# Patient Record
Sex: Female | Born: 1990 | Race: White | Hispanic: No | Marital: Single | Smoking: Never smoker
Health system: Southern US, Community
[De-identification: ages and names within clinical notes are randomized; demographics above are authoritative.]

## PROBLEM LIST (undated history)

## (undated) ENCOUNTER — Inpatient Hospital Stay (HOSPITAL_COMMUNITY): Payer: Self-pay

## (undated) DIAGNOSIS — F419 Anxiety disorder, unspecified: Secondary | ICD-10-CM

---
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
## 2011-04-26 LAB — URINALYSIS, ROUTINE W REFLEX MICROSCOPIC
Bilirubin Urine: NEGATIVE
Glucose, UA: NEGATIVE
Hgb urine dipstick: NEGATIVE
Ketones, ur: NEGATIVE
Nitrite: NEGATIVE
Protein, ur: NEGATIVE
Specific Gravity, Urine: 1.025
Urobilinogen, UA: 0.2
pH: 6

## 2013-07-10 ENCOUNTER — Emergency Department (INDEPENDENT_AMBULATORY_CARE_PROVIDER_SITE_OTHER)
Admission: EM | Admit: 2013-07-10 | Discharge: 2013-07-10 | Disposition: A | Discharge status: Home / Self Care | Payer: Self-pay | Source: Home / Self Care

## 2013-07-10 ENCOUNTER — Encounter (HOSPITAL_COMMUNITY): Payer: Self-pay | Admitting: Emergency Medicine

## 2013-07-10 DIAGNOSIS — R6889 Other general symptoms and signs: Secondary | ICD-10-CM

## 2013-07-10 MED ORDER — HYDROCOD POLST-CHLORPHEN POLST 10-8 MG/5ML PO LQCR: 5.0000 mL | Freq: Two times a day (BID) | ORAL | Status: DC | PRN

## 2013-07-10 MED ORDER — ONDANSETRON HCL 4 MG PO TABS: 4.0000 mg | ORAL_TABLET | Freq: Four times a day (QID) | ORAL | Status: DC

## 2013-07-10 NOTE — ED Notes (Signed)
General body aches, ST, cough

## 2013-07-10 NOTE — ED Provider Notes (Signed)
CSN: [id number]     Arrival date & time 07/10/13  1442 History   First MD Initiated Contact with Patient 07/10/13 1608     Chief Complaint  Patient presents with  . Influenza   (Consider location/radiation/quality/duration/timing/severity/associated sxs/prior Treatment) HPI Comments: 3 days ago this 22 year old female experienced a rather sudden onset of generalized bodyaches, vomiting, sore throat, nasal congestion, cough, fatigue and malaise and headache. She is able to drink small sips of fluid without vomiting. She has been taking Alka-Seltzer cold plus without much relief. She is primarily been in bed for the past 3 days. She states that 2 members of her family had similar symptoms last week.   History reviewed. No pertinent past medical history. History reviewed. No pertinent past surgical history. History reviewed. No pertinent family history. History  Substance Use Topics  . Smoking status: Never Smoker   . Smokeless tobacco: Not on file  . Alcohol Use: Yes   OB History   Grav Para Term Preterm Abortions TAB SAB Ect Mult Living                 Review of Systems  Constitutional: Positive for chills, activity change, appetite change and fatigue. Negative for fever.  HENT: Positive for congestion, postnasal drip, rhinorrhea and sore throat. Negative for facial swelling.   Eyes: Negative.   Respiratory: Positive for cough. Negative for shortness of breath and wheezing.   Cardiovascular: Negative.   Gastrointestinal: Positive for nausea and vomiting. Negative for abdominal pain and blood in stool.  Genitourinary: Negative.   Musculoskeletal: Positive for myalgias. Negative for neck pain and neck stiffness.  Skin: Negative for pallor and rash.  Neurological: Negative.     Allergies  Review of patient's allergies indicates not on file.  Home Medications   Current Outpatient Rx  Name  Route  Sig  Dispense  Refill  . chlorpheniramine-HYDROcodone (TUSSIONEX PENNKINETIC ER)  10-8 MG/5ML LQCR   Oral   Take 5 mLs by mouth every 12 (twelve) hours as needed for cough.   60 mL   0   . ondansetron (ZOFRAN) 4 MG tablet   Oral   Take 1 tablet (4 mg total) by mouth every 6 (six) hours.   12 tablet   0    BP 131/83  Pulse 80  Temp(Src) 98.4 F (36.9 C) (Oral)  Resp 18  SpO2 97%  LMP 06/16/2013 Physical Exam  Nursing note and vitals reviewed. Constitutional: She is oriented to person, place, and time. She appears well-developed and well-nourished. No distress.  HENT:  Head: Normocephalic and atraumatic.  Mouth/Throat: Oropharyngeal exudate present.   bilateral TMs are normal Oropharynx with minor erythema and clear PND.  Eyes: Conjunctivae and EOM are normal.  Neck: Normal range of motion. Neck supple.  Cardiovascular: Normal rate, regular rhythm, normal heart sounds and intact distal pulses.   No murmur heard. Pulmonary/Chest: Effort normal and breath sounds normal. No respiratory distress. She has no wheezes. She has no rales.  Abdominal: Soft. She exhibits no distension and no mass. There is no tenderness. There is no rebound and no guarding.  Musculoskeletal: Normal range of motion. She exhibits no edema and no tenderness.  Lymphadenopathy:    She has no cervical adenopathy.  Neurological: She is alert and oriented to person, place, and time. She exhibits normal muscle tone.  Skin: Skin is warm and dry. No rash noted. No erythema.  Psychiatric: She has a normal mood and affect.    ED Course  Procedures (  including critical care time) Labs Review Labs Reviewed - No data to display Imaging Review No results found.    MDM   1. Flu-like symptoms      Clear fluids, small frequent amts zofran 4mg  tabs tussonex q 12h prn 60 cc  Sedation precautions given. Rest Tylenol prn  Hayden Rasmussen, NP 07/10/13 [phone]

## 2013-07-11 NOTE — ED Provider Notes (Signed)
Medical screening examination/treatment/procedure(s) were performed by resident physician or non-physician practitioner and as supervising physician I was immediately available for consultation/collaboration.   Barkley Bruns MD.   Linna Hoff, MD 07/11/13 [phone]

## 2015-12-04 ENCOUNTER — Ambulatory Visit (HOSPITAL_COMMUNITY)
Admission: EM | Admit: 2015-12-04 | Discharge: 2015-12-04 | Disposition: A | Discharge status: Home / Self Care | Payer: Self-pay | Attending: Emergency Medicine | Admitting: Emergency Medicine

## 2015-12-04 ENCOUNTER — Encounter (HOSPITAL_COMMUNITY): Payer: Self-pay | Admitting: Emergency Medicine

## 2015-12-04 DIAGNOSIS — F419 Anxiety disorder, unspecified: Secondary | ICD-10-CM

## 2015-12-04 DIAGNOSIS — R0789 Other chest pain: Secondary | ICD-10-CM

## 2015-12-04 NOTE — ED Notes (Signed)
Pt discharged by Hayden Rasmussendavid mabe, np

## 2015-12-04 NOTE — Discharge Instructions (Signed)
Generalized Anxiety Disorder Recommend exercising, walking, dancing or whatever as many days of the week as possible and a couple of hours before bedtime since it is worse at night. Your heart and lungs sound very healthy. Generalized anxiety disorder (GAD) is a mental disorder. It interferes with life functions, including relationships, work, and school. GAD is different from normal anxiety, which everyone experiences at some point in their lives in response to specific life events and activities. Normal anxiety actually helps us prepare for and get through these life events and activities. Normal anxiety goes away after the event or activity is over.  GAD causes anxiety that is not necessarily related to specific events or activities. It also causes excess anxiety in proportion to specific events or activities. The anxiety associated with GAD is also difficult to control. GAD can vary from mild to severe. People with severe GAD can have intense waves of anxiety with physical symptoms (panic attacks).  SYMPTOMS The anxiety and worry associated with GAD are difficult to control. This anxiety and worry are related to many life events and activities and also occur more days than not for 6 months or longer. People with GAD also have three or more of the following symptoms (one or more in children):  Restlessness.   Fatigue.  Difficulty concentrating.   Irritability.  Muscle tension.  Difficulty sleeping or unsatisfying sleep. DIAGNOSIS GAD is diagnosed through an assessment by your health care provider. Your health care provider will ask you questions aboutyour mood,physical symptoms, and events in your life. Your health care provider may ask you about your medical history and use of alcohol or drugs, including prescription medicines. Your health care provider may also do a physical exam and blood tests. Certain medical conditions and the use of certain substances can cause symptoms similar to  those associated with GAD. Your health care provider may refer you to a mental health specialist for further evaluation. TREATMENT The following therapies are usually used to treat GAD:   Medication. Antidepressant medication usually is prescribed for long-term daily control. Antianxiety medicines may be added in severe cases, especially when panic attacks occur.   Talk therapy (psychotherapy). Certain types of talk therapy can be helpful in treating GAD by providing support, education, and guidance. A form of talk therapy called cognitive behavioral therapy can teach you healthy ways to think about and react to daily life events and activities.  Stress managementtechniques. These include yoga, meditation, and exercise and can be very helpful when they are practiced regularly. A mental health specialist can help determine which treatment is best for you. Some people see improvement with one therapy. However, other people require a combination of therapies.   This information is not intended to replace advice given to you by your health care provider. Make sure you discuss any questions you have with your health care provider.   Document Released: 11/05/2012 Document Revised: 08/01/2014 Document Reviewed: 11/05/2012 Elsevier Interactive Patient Education 2016 [employer]  Panic Attacks Panic attacks are sudden, short feelings of great fear or discomfort. You may have them for no reason when you are relaxed, when you are uneasy (anxious), or when you are sleeping.  HOME CARE  Take all your medicines as told.  Check with your doctor before starting new medicines.  Keep all doctor visits. GET HELP IF:  You are not able to take your medicines as told.  Your symptoms do not get better.  Your symptoms get worse. GET HELP RIGHT AWAY IF:  Your attacks seem different than your normal attacks.  You have thoughts about hurting yourself or others.  You take panic attack medicine and you  have a side effect. MAKE SURE YOU:  Understand these instructions.  Will watch your condition.  Will get help right away if you are not doing well or get worse.   This information is not intended to replace advice given to you by your health care provider. Make sure you discuss any questions you have with your health care provider.   Document Released: 08/13/2010 Document Revised: 05/01/2013 Document Reviewed: 02/22/2013 Elsevier Interactive Patient Education 2016 [employer]  Chest Pain Observation It is often hard to give a specific diagnosis for the cause of chest pain. Among other possibilities your symptoms might be caused by inadequate oxygen delivery to your heart (angina). Angina that is not treated or evaluated can lead to a heart attack (myocardial infarction) or death. Blood tests, electrocardiograms, and X-rays may have been done to help determine a possible cause of your chest pain. After evaluation and observation, your health care provider has determined that it is unlikely your pain was caused by an unstable condition that requires hospitalization. However, a full evaluation of your pain may need to be completed, with additional diagnostic testing as directed. It is very important to keep your follow-up appointments. Not keeping your follow-up appointments could result in permanent heart damage, disability, or death. If there is any problem keeping your follow-up appointments, you must call your health care provider. HOME CARE INSTRUCTIONS  Due to the slight chance that your pain could be angina, it is important to follow your health care provider's treatment plan and also maintain a healthy lifestyle:  Maintain or work toward achieving a healthy weight.  Stay physically active and exercise regularly.  Decrease your salt intake.  Eat a balanced, healthy diet. Talk to a dietitian to learn about heart-healthy foods.  Increase your fiber intake by including whole grains,  vegetables, fruits, and nuts in your diet.  Avoid situations that cause stress, anger, or depression.  Take medicines as advised by your health care provider. Report any side effects to your health care provider. Do not stop medicines or adjust the dosages on your own.  Quit smoking. Do not use nicotine patches or gum until you check with your health care provider.  Keep your blood pressure, blood sugar, and cholesterol levels within normal limits.  Limit alcohol intake to no more than 1 drink per day for women who are not pregnant and 2 drinks per day for men.  Do not abuse drugs. SEEK IMMEDIATE MEDICAL CARE IF: You have severe chest pain or pressure which may include symptoms such as:  You feel pain or pressure in your arms, neck, jaw, or back.  You have severe back or abdominal pain, feel sick to your stomach (nauseous), or throw up (vomit).  You are sweating profusely.  You are having a fast or irregular heartbeat.  You feel short of breath while at rest.  You notice increasing shortness of breath during rest, sleep, or with activity.  You have chest pain that does not get better after rest or after taking your usual medicine.  You wake from sleep with chest pain.  You are unable to sleep because you cannot breathe.  You develop a frequent cough or you are coughing up blood.  You feel dizzy, faint, or experience extreme fatigue.  You develop severe weakness, dizziness, fainting, or chills. Any of these symptoms may  represent a serious problem that is an emergency. Do not wait to see if the symptoms will go away. Call your local emergency services (911 in the [place] Do not drive yourself to the hospital. MAKE SURE YOU:  Understand these instructions.  Will watch your condition.  Will get help right away if you are not doing well or get worse.   This information is not intended to replace advice given to you by your health care provider. Make sure you discuss any  questions you have with your health care provider.   Document Released: 08/13/2010 Document Revised: 07/16/2013 Document Reviewed: 01/10/2013 Elsevier Interactive Patient Education 2016 [employer] Inc.  Nonspecific Chest Pain  Chest pain can be caused by many different conditions. There is always a chance that your pain could be related to something serious, such as a heart attack or a blood clot in your lungs. Chest pain can also be caused by conditions that are not life-threatening. If you have chest pain, it is very important to follow up with your health care provider. CAUSES  Chest pain can be caused by:  Heartburn.  Pneumonia or bronchitis.  Anxiety or stress.  Inflammation around your heart (pericarditis) or lung (pleuritis or pleurisy).  A blood clot in your lung.  A collapsed lung (pneumothorax). It can develop suddenly on its own (spontaneous pneumothorax) or from trauma to the chest.  Shingles infection (varicella-zoster virus).  Heart attack.  Damage to the bones, muscles, and cartilage that make up your chest wall. This can include:  Bruised bones due to injury.  Strained muscles or cartilage due to frequent or repeated coughing or overwork.  Fracture to one or more ribs.  Sore cartilage due to inflammation (costochondritis). RISK FACTORS  Risk factors for chest pain may include:  Activities that increase your risk for trauma or injury to your chest.  Respiratory infections or conditions that cause frequent coughing.  Medical conditions or overeating that can cause heartburn.  Heart disease or family history of heart disease.  Conditions or health behaviors that increase your risk of developing a blood clot.  Having had chicken pox (varicella zoster). SIGNS AND SYMPTOMS Chest pain can feel like:  Burning or tingling on the surface of your chest or deep in your chest.  Crushing, pressure, aching, or squeezing pain.  Dull or sharp pain that is worse  when you move, cough, or take a deep breath.  Pain that is also felt in your back, neck, shoulder, or arm, or pain that spreads to any of these areas. Your chest pain may come and go, or it may stay constant. DIAGNOSIS Lab tests or other studies may be needed to find the cause of your pain. Your health care provider may have you take a test called an ambulatory ECG (electrocardiogram). An ECG records your heartbeat patterns at the time the test is performed. You may also have other tests, such as:  Transthoracic echocardiogram (TTE). During echocardiography, sound waves are used to create a picture of all of the heart structures and to look at how blood flows through your heart.  Transesophageal echocardiogram (TEE).This is a more advanced imaging test that obtains images from inside your body. It allows your health care provider to see your heart in finer detail.  Cardiac monitoring. This allows your health care provider to monitor your heart rate and rhythm in real time.  Holter monitor. This is a portable device that records your heartbeat and can help to diagnose abnormal heartbeats. It allows  your health care provider to track your heart activity for several days, if needed.  Stress tests. These can be done through exercise or by taking medicine that makes your heart beat more quickly.  Blood tests.  Imaging tests. TREATMENT  Your treatment depends on what is causing your chest pain. Treatment may include:  Medicines. These may include:  Acid blockers for heartburn.  Anti-inflammatory medicine.  Pain medicine for inflammatory conditions.  Antibiotic medicine, if an infection is present.  Medicines to dissolve blood clots.  Medicines to treat coronary artery disease.  Supportive care for conditions that do not require medicines. This may include:  Resting.  Applying heat or cold packs to injured areas.  Limiting activities until pain decreases. HOME CARE  INSTRUCTIONS  If you were prescribed an antibiotic medicine, finish it all even if you start to feel better.  Avoid any activities that bring on chest pain.  Do not use any tobacco products, including cigarettes, chewing tobacco, or electronic cigarettes. If you need help quitting, ask your health care provider.  Do not drink alcohol.  Take medicines only as directed by your health care provider.  Keep all follow-up visits as directed by your health care provider. This is important. This includes any further testing if your chest pain does not go away.  If heartburn is the cause for your chest pain, you may be told to keep your head raised (elevated) while sleeping. This reduces the chance that acid will go from your stomach into your esophagus.  Make lifestyle changes as directed by your health care provider. These may include:  Getting regular exercise. Ask your health care provider to suggest some activities that are safe for you.  Eating a heart-healthy diet. A registered dietitian can help you to learn healthy eating options.  Maintaining a healthy weight.  Managing diabetes, if necessary.  Reducing stress. SEEK MEDICAL CARE IF:  Your chest pain does not go away after treatment.  You have a rash with blisters on your chest.  You have a fever. SEEK IMMEDIATE MEDICAL CARE IF:   Your chest pain is worse.  You have an increasing cough, or you cough up blood.  You have severe abdominal pain.  You have severe weakness.  You faint.  You have chills.  You have sudden, unexplained chest discomfort.  You have sudden, unexplained discomfort in your arms, back, neck, or jaw.  You have shortness of breath at any time.  You suddenly start to sweat, or your skin gets clammy.  You feel nauseous or you vomit.  You suddenly feel light-headed or dizzy.  Your heart begins to beat quickly, or it feels like it is skipping beats. These symptoms may represent a serious  problem that is an emergency. Do not wait to see if the symptoms will go away. Get medical help right away. Call your local emergency services (911 in the [place] Do not drive yourself to the hospital.   This information is not intended to replace advice given to you by your health care provider. Make sure you discuss any questions you have with your health care provider.   Document Released: 04/20/2005 Document Revised: 08/01/2014 Document Reviewed: 02/14/2014 Elsevier Interactive Patient Education [employer]

## 2015-12-04 NOTE — ED Notes (Signed)
C/o intermittent SOB and CP onset x1 week... Does not have med ins and no PCP... Voices no other concerns A&O x4... No acute distress.

## 2015-12-04 NOTE — ED Provider Notes (Signed)
CSN: [id number]     Arrival date & time 12/04/15  1744 History   First MD Initiated Contact with Patient 12/04/15 1827     Chief Complaint  Patient presents with  . Shortness of Breath  . Chest Pain   (Consider location/radiation/quality/duration/timing/severity/associated sxs/prior Treatment) HPI Comments: 25 year old female states that she feels like she needs to yawn but cannot. She is unable to force herself to yawn. When specifically asked she states that she does feel like she has to take deep breaths more often. Sometimes she feels like she cannot quite get a deep enough breath. She denies shortness of breath, cough or recent chills or fever. She states it occurs primarily at night when she is alone. Occurs less during the day when she is working or distracted. She does not have exertional symptoms. She noticed the need to take a deep breath frequently approximately one week ago. Today she had a couple of episodes of right anterior chest discomfort. No discomfort now. Nothing makes it worse and nothing makes it better. Denies GI or GU symptoms.  She does have a history of panic attacks.   History reviewed. No pertinent past medical history. History reviewed. No pertinent past surgical history. No family history on file. Social History  Substance Use Topics  . Smoking status: Never Smoker   . Smokeless tobacco: None  . Alcohol Use: Yes   OB History    No data available     Review of Systems  Constitutional: Negative for fever, activity change and fatigue.  HENT: Positive for congestion, postnasal drip and rhinorrhea. Negative for sore throat, tinnitus and trouble swallowing.   Eyes: Negative.   Respiratory: Negative for cough, choking, chest tightness, wheezing and stridor.   Cardiovascular: Positive for chest pain. Negative for palpitations and leg swelling.  Gastrointestinal: Negative.   Genitourinary: Negative.   Musculoskeletal: Negative.   Neurological: Negative.    Psychiatric/Behavioral: The patient is nervous/anxious.     Allergies  Review of patient's allergies indicates no known allergies.  Home Medications   Prior to Admission medications   Medication Sig Start Date End Date Taking? Authorizing Provider  chlorpheniramine-HYDROcodone (TUSSIONEX PENNKINETIC ER) 10-8 MG/5ML LQCR Take 5 mLs by mouth every 12 (twelve) hours as needed for cough. 07/10/13   Hayden Rasmussenavid Shabnam Ladd, NP  ondansetron (ZOFRAN) 4 MG tablet Take 1 tablet (4 mg total) by mouth every 6 (six) hours. 07/10/13   Hayden Rasmussenavid Montserrath Madding, NP   Meds Ordered and Administered this Visit  Medications - No data to display  LMP 11/27/2015 No data found.   Physical Exam  Constitutional: She is oriented to person, place, and time. She appears well-developed and well-nourished. No distress.  HENT:  Mouth/Throat: No oropharyngeal exudate.  Bilateral TMs are normal. Oropharynx with scant clear drainage otherwise normal.  Eyes: Conjunctivae and EOM are normal. Pupils are equal, round, and reactive to light.  Neck: Normal range of motion. Neck supple.  Cardiovascular: Normal rate, regular rhythm, normal heart sounds and intact distal pulses.   No murmur heard. Pulmonary/Chest: Effort normal and breath sounds normal. No respiratory distress. She has no wheezes. She has no rales. She exhibits no tenderness.  Abdominal: Soft. There is no tenderness.  Musculoskeletal: Normal range of motion. She exhibits no edema or tenderness.  Lymphadenopathy:    She has no cervical adenopathy.  Neurological: She is alert and oriented to person, place, and time. She exhibits normal muscle tone.  Skin: Skin is warm and dry.  Psychiatric: She has a normal  mood and affect.  Nursing note and vitals reviewed.   ED Course  Procedures (including critical care time)  Labs Review Labs Reviewed - No data to display  Imaging Review No results found.   Visual Acuity Review  Right Eye Distance:   Left Eye Distance:    Bilateral Distance:    Right Eye Near:   Left Eye Near:    Bilateral Near:         MDM   1. Anxiety   2. Atypical chest pain    Recommend exercising, walking, dancing or whatever as many days of the week as possible and a couple of hours before bedtime since it is worse at night. Your heart and lungs sound very healthy. Reassurance Return as needed  No family history of early death or known early CAD or pulmonary problems. Nonsmoker. Denies use of drugs.    Hayden Rasmussen, NP 12/04/15 1911

## 2016-02-19 ENCOUNTER — Encounter (HOSPITAL_COMMUNITY): Payer: Self-pay | Admitting: Certified Registered Nurse Anesthetist

## 2016-02-19 ENCOUNTER — Emergency Department (HOSPITAL_COMMUNITY): Payer: Self-pay | Admitting: Certified Registered Nurse Anesthetist

## 2016-02-19 ENCOUNTER — Observation Stay (HOSPITAL_COMMUNITY)
Admission: EM | Admit: 2016-02-19 | Discharge: 2016-02-20 | Disposition: A | Discharge status: Home / Self Care | Payer: Self-pay | Attending: Orthopedic Surgery | Admitting: Orthopedic Surgery

## 2016-02-19 ENCOUNTER — Encounter (HOSPITAL_COMMUNITY)
Admission: EM | Disposition: A | Discharge status: Home / Self Care | Payer: Self-pay | Source: Home / Self Care | Attending: Emergency Medicine

## 2016-02-19 ENCOUNTER — Emergency Department (HOSPITAL_COMMUNITY): Payer: Self-pay

## 2016-02-19 DIAGNOSIS — S66829A Laceration of other specified muscles, fascia and tendons at wrist and hand level, unspecified hand, initial encounter: Secondary | ICD-10-CM | POA: Diagnosis present

## 2016-02-19 DIAGNOSIS — F419 Anxiety disorder, unspecified: Secondary | ICD-10-CM | POA: Insufficient documentation

## 2016-02-19 DIAGNOSIS — S62391B Other fracture of second metacarpal bone, left hand, initial encounter for open fracture: Secondary | ICD-10-CM | POA: Insufficient documentation

## 2016-02-19 DIAGNOSIS — S62502B Fracture of unspecified phalanx of left thumb, initial encounter for open fracture: Secondary | ICD-10-CM | POA: Insufficient documentation

## 2016-02-19 DIAGNOSIS — S64491A Injury of digital nerve of left index finger, initial encounter: Secondary | ICD-10-CM | POA: Insufficient documentation

## 2016-02-19 DIAGNOSIS — W25XXXA Contact with sharp glass, initial encounter: Secondary | ICD-10-CM | POA: Insufficient documentation

## 2016-02-19 DIAGNOSIS — S61213A Laceration without foreign body of left middle finger without damage to nail, initial encounter: Secondary | ICD-10-CM

## 2016-02-19 DIAGNOSIS — S66123A Laceration of flexor muscle, fascia and tendon of left middle finger at wrist and hand level, initial encounter: Secondary | ICD-10-CM | POA: Insufficient documentation

## 2016-02-19 DIAGNOSIS — S66523A Laceration of intrinsic muscle, fascia and tendon of left middle finger at wrist and hand level, initial encounter: Secondary | ICD-10-CM

## 2016-02-19 DIAGNOSIS — S61211A Laceration without foreign body of left index finger without damage to nail, initial encounter: Secondary | ICD-10-CM | POA: Insufficient documentation

## 2016-02-19 DIAGNOSIS — S61409A Unspecified open wound of unspecified hand, initial encounter: Secondary | ICD-10-CM | POA: Diagnosis present

## 2016-02-19 DIAGNOSIS — Z23 Encounter for immunization: Secondary | ICD-10-CM | POA: Insufficient documentation

## 2016-02-19 DIAGNOSIS — S61012A Laceration without foreign body of left thumb without damage to nail, initial encounter: Secondary | ICD-10-CM

## 2016-02-19 DIAGNOSIS — IMO0002 Reserved for concepts with insufficient information to code with codable children: Secondary | ICD-10-CM

## 2016-02-19 DIAGNOSIS — S51812A Laceration without foreign body of left forearm, initial encounter: Principal | ICD-10-CM | POA: Insufficient documentation

## 2016-02-19 HISTORY — PX: NERVE REPAIR: SHX2083

## 2016-02-19 HISTORY — PX: I&D EXTREMITY: SHX5045

## 2016-02-19 HISTORY — DX: Anxiety disorder, unspecified: F41.9

## 2016-02-19 HISTORY — PX: TENDON REPAIR: SHX5111

## 2016-02-19 LAB — CBC WITH DIFFERENTIAL/PLATELET
BASOS PCT: 0 %
Basophils Absolute: 0 10*3/uL (ref 0.0–0.1)
EOS ABS: 0 10*3/uL (ref 0.0–0.7)
Eosinophils Relative: 0 %
HCT: 35.8 % — ABNORMAL LOW (ref 36.0–46.0)
Hemoglobin: 12.2 g/dL (ref 12.0–15.0)
Lymphocytes Relative: 19 %
Lymphs Abs: 1.3 10*3/uL (ref 0.7–4.0)
MCH: 29.5 pg (ref 26.0–34.0)
MCHC: 34.1 g/dL (ref 30.0–36.0)
MCV: 86.7 fL (ref 78.0–100.0)
MONO ABS: 0.5 10*3/uL (ref 0.1–1.0)
MONOS PCT: 7 %
NEUTROS PCT: 74 %
Neutro Abs: 5.1 10*3/uL (ref 1.7–7.7)
Platelets: 246 10*3/uL (ref 150–400)
RBC: 4.13 MIL/uL (ref 3.87–5.11)
RDW: 12.4 % (ref 11.5–15.5)
WBC: 6.9 10*3/uL (ref 4.0–10.5)

## 2016-02-19 LAB — I-STAT CHEM 8, ED
BUN: 10 mg/dL (ref 6–20)
Calcium, Ion: 1.17 mmol/L (ref 1.13–1.30)
Chloride: 105 mmol/L (ref 101–111)
Creatinine, Ser: 0.5 mg/dL (ref 0.44–1.00)
Glucose, Bld: 104 mg/dL — ABNORMAL HIGH (ref 65–99)
HEMATOCRIT: 36 % (ref 36.0–46.0)
HEMOGLOBIN: 12.2 g/dL (ref 12.0–15.0)
Potassium: 3.5 mmol/L (ref 3.5–5.1)
SODIUM: 140 mmol/L (ref 135–145)
TCO2: 22 mmol/L (ref 0–100)

## 2016-02-19 LAB — I-STAT BETA HCG BLOOD, ED (MC, WL, AP ONLY)

## 2016-02-19 SURGERY — IRRIGATION AND DEBRIDEMENT EXTREMITY
Anesthesia: General | Site: Arm Lower | Laterality: Left

## 2016-02-19 MED ORDER — MIDAZOLAM HCL 2 MG/2ML IJ SOLN
1.0000 mg | Freq: Once | INTRAMUSCULAR | Status: AC
  Administered 2016-02-19: 1 mg via INTRAVENOUS

## 2016-02-19 MED ORDER — LACTATED RINGERS IV SOLN
INTRAVENOUS | Status: DC
Start: 2016-02-19 — End: 2016-02-20
  Administered 2016-02-19: 22:00:00 via INTRAVENOUS

## 2016-02-19 MED ORDER — SODIUM CHLORIDE 0.9 % IR SOLN
Status: DC | PRN
  Administered 2016-02-19: 3000 mL

## 2016-02-19 MED ORDER — MIDAZOLAM HCL 2 MG/2ML IJ SOLN
INTRAMUSCULAR | Status: AC
  Filled 2016-02-19: qty 2

## 2016-02-19 MED ORDER — ONDANSETRON HCL 4 MG/2ML IJ SOLN
4.0000 mg | Freq: Once | INTRAMUSCULAR | Status: AC
  Administered 2016-02-19: 4 mg via INTRAVENOUS
  Filled 2016-02-19: qty 2

## 2016-02-19 MED ORDER — SODIUM CHLORIDE 0.9 % IR SOLN
Status: DC | PRN
  Administered 2016-02-19: 1000 mL

## 2016-02-19 MED ORDER — METHOCARBAMOL 500 MG PO TABS
ORAL_TABLET | ORAL | Status: AC
  Filled 2016-02-19: qty 1

## 2016-02-19 MED ORDER — PHENYLEPHRINE HCL 10 MG/ML IJ SOLN
INTRAMUSCULAR | Status: DC | PRN
Start: 2016-02-19 — End: 2016-02-19
  Administered 2016-02-19 (×2): 120 ug via INTRAVENOUS

## 2016-02-19 MED ORDER — FAMOTIDINE 20 MG PO TABS: 20.0000 mg | ORAL_TABLET | Freq: Two times a day (BID) | ORAL | Status: DC | PRN

## 2016-02-19 MED ORDER — CEFAZOLIN IN D5W 1 GM/50ML IV SOLN
1.0000 g | Freq: Three times a day (TID) | INTRAVENOUS | Status: DC
  Administered 2016-02-20 (×3): 1 g via INTRAVENOUS
  Filled 2016-02-19 (×5): qty 50

## 2016-02-19 MED ORDER — HYDROMORPHONE HCL 1 MG/ML IJ SOLN
0.2500 mg | INTRAMUSCULAR | Status: DC | PRN
  Administered 2016-02-19 (×2): 0.25 mg via INTRAVENOUS
  Administered 2016-02-19: 0.5 mg via INTRAVENOUS

## 2016-02-19 MED ORDER — METHOCARBAMOL 1000 MG/10ML IJ SOLN
500.0000 mg | Freq: Four times a day (QID) | INTRAMUSCULAR | Status: DC | PRN
  Filled 2016-02-19: qty 5

## 2016-02-19 MED ORDER — PROMETHAZINE HCL 25 MG RE SUPP: 12.5000 mg | Freq: Four times a day (QID) | RECTAL | Status: DC | PRN

## 2016-02-19 MED ORDER — PROMETHAZINE HCL 25 MG/ML IJ SOLN: 6.2500 mg | INTRAMUSCULAR | Status: DC | PRN

## 2016-02-19 MED ORDER — MORPHINE SULFATE (PF) 4 MG/ML IV SOLN
4.0000 mg | Freq: Once | INTRAVENOUS | Status: AC
  Administered 2016-02-19: 4 mg via INTRAVENOUS
  Filled 2016-02-19: qty 1

## 2016-02-19 MED ORDER — PROPOFOL 10 MG/ML IV BOLUS
INTRAVENOUS | Status: DC | PRN
  Administered 2016-02-19: 200 mg via INTRAVENOUS

## 2016-02-19 MED ORDER — FENTANYL CITRATE (PF) 250 MCG/5ML IJ SOLN
INTRAMUSCULAR | Status: DC | PRN
  Administered 2016-02-19: 50 ug via INTRAVENOUS
  Administered 2016-02-19: 25 ug via INTRAVENOUS
  Administered 2016-02-19: 50 ug via INTRAVENOUS

## 2016-02-19 MED ORDER — ONDANSETRON HCL 4 MG PO TABS: 4.0000 mg | ORAL_TABLET | Freq: Four times a day (QID) | ORAL | Status: DC | PRN

## 2016-02-19 MED ORDER — DEXAMETHASONE SODIUM PHOSPHATE 10 MG/ML IJ SOLN
INTRAMUSCULAR | Status: DC | PRN
  Administered 2016-02-19: 10 mg via INTRAVENOUS

## 2016-02-19 MED ORDER — SODIUM CHLORIDE 0.9 % IV SOLN
INTRAVENOUS | Status: DC
  Administered 2016-02-19: 15:00:00 via INTRAVENOUS

## 2016-02-19 MED ORDER — LORAZEPAM 2 MG/ML IJ SOLN
1.0000 mg | Freq: Once | INTRAMUSCULAR | Status: AC
  Administered 2016-02-19: 1 mg via INTRAVENOUS
  Filled 2016-02-19: qty 1

## 2016-02-19 MED ORDER — ALPRAZOLAM 0.5 MG PO TABS
0.5000 mg | ORAL_TABLET | Freq: Four times a day (QID) | ORAL | Status: DC | PRN
  Administered 2016-02-19 – 2016-02-20 (×2): 0.5 mg via ORAL
  Filled 2016-02-19 (×2): qty 1

## 2016-02-19 MED ORDER — DEXAMETHASONE SODIUM PHOSPHATE 10 MG/ML IJ SOLN
INTRAMUSCULAR | Status: AC
Start: 2016-02-19 — End: 2016-02-19
  Filled 2016-02-19: qty 1

## 2016-02-19 MED ORDER — PROPOFOL 10 MG/ML IV BOLUS
INTRAVENOUS | Status: AC
  Filled 2016-02-19: qty 20

## 2016-02-19 MED ORDER — MIDAZOLAM HCL 5 MG/5ML IJ SOLN
INTRAMUSCULAR | Status: DC | PRN
  Administered 2016-02-19 (×2): 2 mg via INTRAVENOUS

## 2016-02-19 MED ORDER — HYDROMORPHONE HCL 1 MG/ML IJ SOLN
INTRAMUSCULAR | Status: AC
  Filled 2016-02-19: qty 1

## 2016-02-19 MED ORDER — METHOCARBAMOL 500 MG PO TABS
500.0000 mg | ORAL_TABLET | Freq: Four times a day (QID) | ORAL | Status: DC | PRN
  Administered 2016-02-19: 500 mg via ORAL

## 2016-02-19 MED ORDER — PHENYLEPHRINE 40 MCG/ML (10ML) SYRINGE FOR IV PUSH (FOR BLOOD PRESSURE SUPPORT)
PREFILLED_SYRINGE | INTRAVENOUS | Status: AC
  Filled 2016-02-19: qty 20

## 2016-02-19 MED ORDER — DOCUSATE SODIUM 100 MG PO CAPS
100.0000 mg | ORAL_CAPSULE | Freq: Two times a day (BID) | ORAL | Status: DC
  Administered 2016-02-19 – 2016-02-20 (×2): 100 mg via ORAL
  Filled 2016-02-19 (×2): qty 1

## 2016-02-19 MED ORDER — FENTANYL CITRATE (PF) 250 MCG/5ML IJ SOLN
INTRAMUSCULAR | Status: AC
  Filled 2016-02-19: qty 5

## 2016-02-19 MED ORDER — TETANUS-DIPHTH-ACELL PERTUSSIS 5-2.5-18.5 LF-MCG/0.5 IM SUSP
0.5000 mL | Freq: Once | INTRAMUSCULAR | Status: AC
  Administered 2016-02-19: 0.5 mL via INTRAMUSCULAR
  Filled 2016-02-19: qty 0.5

## 2016-02-19 MED ORDER — LACTATED RINGERS IV SOLN
INTRAVENOUS | Status: DC
  Administered 2016-02-19 (×2): via INTRAVENOUS

## 2016-02-19 MED ORDER — CEFAZOLIN IN D5W 1 GM/50ML IV SOLN
1.0000 g | Freq: Once | INTRAVENOUS | Status: AC
  Administered 2016-02-19: 1 g via INTRAVENOUS
  Filled 2016-02-19: qty 50

## 2016-02-19 MED ORDER — MIDAZOLAM HCL 2 MG/2ML IJ SOLN
INTRAMUSCULAR | Status: AC
  Administered 2016-02-19: 1 mg via INTRAVENOUS
  Filled 2016-02-19: qty 2

## 2016-02-19 MED ORDER — VITAMIN C 500 MG PO TABS
1000.0000 mg | ORAL_TABLET | Freq: Every day | ORAL | Status: DC
  Administered 2016-02-19 – 2016-02-20 (×2): 1000 mg via ORAL
  Filled 2016-02-19 (×2): qty 2

## 2016-02-19 MED ORDER — OXYCODONE HCL 5 MG PO TABS
5.0000 mg | ORAL_TABLET | ORAL | Status: DC | PRN
  Administered 2016-02-19 – 2016-02-20 (×3): 10 mg via ORAL
  Filled 2016-02-19 (×3): qty 2

## 2016-02-19 MED ORDER — OXYCODONE HCL 5 MG PO TABS
ORAL_TABLET | ORAL | Status: AC
  Administered 2016-02-20: 10 mg via ORAL
  Filled 2016-02-19: qty 2

## 2016-02-19 MED ORDER — HYDROMORPHONE HCL 1 MG/ML IJ SOLN
1.0000 mg | Freq: Once | INTRAMUSCULAR | Status: AC
  Administered 2016-02-19: 1 mg via INTRAVENOUS
  Filled 2016-02-19: qty 1

## 2016-02-19 MED ORDER — ONDANSETRON HCL 4 MG/2ML IJ SOLN: 4.0000 mg | Freq: Four times a day (QID) | INTRAMUSCULAR | Status: DC | PRN

## 2016-02-19 MED ORDER — MORPHINE SULFATE (PF) 2 MG/ML IV SOLN
2.0000 mg | INTRAVENOUS | Status: DC | PRN
Start: 2016-02-19 — End: 2016-02-19
  Administered 2016-02-19: 4 mg via INTRAVENOUS
  Filled 2016-02-19: qty 2

## 2016-02-19 MED ORDER — CEFAZOLIN SODIUM 1 G IJ SOLR
INTRAMUSCULAR | Status: DC | PRN
  Administered 2016-02-19: 2 g via INTRAMUSCULAR

## 2016-02-19 MED ORDER — MORPHINE SULFATE (PF) 2 MG/ML IV SOLN
1.0000 mg | INTRAVENOUS | Status: DC | PRN
  Administered 2016-02-19 – 2016-02-20 (×4): 1 mg via INTRAVENOUS
  Filled 2016-02-19 (×4): qty 1

## 2016-02-19 SURGICAL SUPPLY — 48 items
BANDAGE ACE 4X5 VEL STRL LF (GAUZE/BANDAGES/DRESSINGS) ×4 IMPLANT
BNDG GAUZE ELAST 4 BULKY (GAUZE/BANDAGES/DRESSINGS) ×4 IMPLANT
CORDS BIPOLAR (ELECTRODE) ×3 IMPLANT
CUFF TOURNIQUET SINGLE 18IN (TOURNIQUET CUFF) ×3 IMPLANT
DRSG ADAPTIC 3X8 NADH LF (GAUZE/BANDAGES/DRESSINGS) ×4 IMPLANT
GAUZE SPONGE 4X4 12PLY STRL (GAUZE/BANDAGES/DRESSINGS) ×2 IMPLANT
GAUZE XEROFORM 5X9 LF (GAUZE/BANDAGES/DRESSINGS) ×2 IMPLANT
GLOVE BIOGEL M 8.0 STRL (GLOVE) ×5 IMPLANT
GLOVE SS BIOGEL STRL SZ 8 (GLOVE) ×1 IMPLANT
GLOVE SUPERSENSE BIOGEL SZ 8 (GLOVE) ×6
GOWN STRL REUS W/ TWL LRG LVL3 (GOWN DISPOSABLE) ×1 IMPLANT
GOWN STRL REUS W/ TWL XL LVL3 (GOWN DISPOSABLE) ×2 IMPLANT
GOWN STRL REUS W/TWL LRG LVL3 (GOWN DISPOSABLE) ×6
GOWN STRL REUS W/TWL XL LVL3 (GOWN DISPOSABLE) ×9
IV NS IRRIG 3000ML ARTHROMATIC (IV SOLUTION) ×2 IMPLANT
KIT BASIN OR (CUSTOM PROCEDURE TRAY) ×3 IMPLANT
KIT ROOM TURNOVER OR (KITS) ×3 IMPLANT
LOOP VESSEL MAXI BLUE (MISCELLANEOUS) ×2 IMPLANT
LOOP VESSEL MINI RED (MISCELLANEOUS) ×2 IMPLANT
MANIFOLD NEPTUNE II (INSTRUMENTS) ×3 IMPLANT
NERVE PROTECTOR NEURAWRAP 3MM (Tissue) ×3 IMPLANT
NS IRRIG 1000ML POUR BTL (IV SOLUTION) ×3 IMPLANT
PACK ORTHO EXTREMITY (CUSTOM PROCEDURE TRAY) ×3 IMPLANT
PAD ARMBOARD 7.5X6 YLW CONV (MISCELLANEOUS) ×3 IMPLANT
PAD CAST 4YDX4 CTTN HI CHSV (CAST SUPPLIES) IMPLANT
PADDING CAST COTTON 4X4 STRL (CAST SUPPLIES) ×3
PROTECTOR NRV 4X32 PEEL NERWRP (Tissue) IMPLANT
PRTC NRV 4X32 PEEL NEURAWRAP (Tissue) ×1 IMPLANT
SPLINT FIBERGLASS 3X12 (CAST SUPPLIES) ×6 IMPLANT
SPONGE LAP 4X18 X RAY DECT (DISPOSABLE) ×3 IMPLANT
STOCKINETTE TUBULAR 6 INCH (GAUZE/BANDAGES/DRESSINGS) ×2 IMPLANT
SUT CHROMIC 4 0 PS 2 18 (SUTURE) ×2 IMPLANT
SUT CHROMIC 5 0 P 3 (SUTURE) ×2 IMPLANT
SUT ETHILON 9 0 BV130 4 (SUTURE) ×2 IMPLANT
SUT FIBERWIRE 3-0 18 TAPR NDL (SUTURE) ×12
SUT PROLENE 3 0 PS 2 (SUTURE) ×4 IMPLANT
SUT PROLENE 4 0 P 3 18 (SUTURE) ×4 IMPLANT
SUT PROLENE 5 0 P 3 (SUTURE) ×12 IMPLANT
SUT PROLENE 6 0 P 1 18 (SUTURE) ×2 IMPLANT
SUT PROLENE 7 0 P 1 (SUTURE) ×2 IMPLANT
SUTURE FIBERWR 3-0 18 TAPR NDL (SUTURE) IMPLANT
TOWEL OR 17X24 6PK STRL BLUE (TOWEL DISPOSABLE) ×3 IMPLANT
TOWEL OR 17X26 10 PK STRL BLUE (TOWEL DISPOSABLE) ×3 IMPLANT
TUBE CONNECTING 12'X1/4 (SUCTIONS) ×1
TUBE CONNECTING 12X1/4 (SUCTIONS) ×2 IMPLANT
TUBING CYSTO DISP (UROLOGICAL SUPPLIES) ×2 IMPLANT
WATER STERILE IRR 1000ML POUR (IV SOLUTION) ×3 IMPLANT
YANKAUER SUCT BULB TIP NO VENT (SUCTIONS) ×3 IMPLANT

## 2016-02-19 NOTE — Transfer of Care (Signed)
Immediate Anesthesia Transfer of Care Note  Patient: Heidi Fritz  Procedure(s) Performed: Procedure(s): IRRIGATION AND DEBRIDEMENT  AND REPAIR AS NECESSARY ARM , WRIST (Left)  Patient Location: PACU  Anesthesia Type:General  Level of Consciousness: awake, alert  and oriented  Airway & Oxygen Therapy: Patient Spontanous Breathing and Patient connected to nasal cannula oxygen  Post-op Assessment: Report given to RN and Post -op Vital signs reviewed and stable  Post vital signs: Reviewed and stable  Last Vitals:  Vitals:   02/19/16 1245 02/19/16 1330  BP: 102/67 104/73  Pulse: 88 80  Resp:    Temp:      Last Pain:  Vitals:   02/19/16 1513  TempSrc:   PainSc: Asleep         Complications: No apparent anesthesia complications

## 2016-02-19 NOTE — Anesthesia Postprocedure Evaluation (Signed)
Anesthesia Post Note  Patient: [name]  Procedure(s) Performed: Procedure(s) (LRB): IRRIGATION AND DEBRIDEMENT  AND REPAIR AS NECESSARY ARM , WRIST (Left) TENDON REPAIR (Left) NERVE REPAIR (Left)  Patient location during evaluation: PACU Anesthesia Type: General Level of consciousness: awake and alert Pain management: pain level controlled Vital Signs Assessment: post-procedure vital signs reviewed and stable Respiratory status: spontaneous breathing, nonlabored ventilation, respiratory function stable and patient connected to nasal cannula oxygen Cardiovascular status: blood pressure returned to baseline and stable Postop Assessment: no signs of nausea or vomiting Anesthetic complications: no    Last Vitals:  Vitals:   02/19/16 2015 02/19/16 2018  BP:    Pulse: 92   Resp: 12   Temp:  36.2 C    Last Pain:  Vitals:   02/19/16 2000  TempSrc:   PainSc: Asleep                 Dorethia Jeanmarie,W. EDMOND

## 2016-02-19 NOTE — ED Triage Notes (Signed)
Per [facility] patient slammed glass door and pushed through glass pane of door.  Laceration to left wrist and partial amputation of middle finger, wrapped by EMS, bleeding controlled.  Patient states she has difficulty moving middle finger, states diminished sensation in middle finger.

## 2016-02-19 NOTE — Anesthesia Preprocedure Evaluation (Addendum)
Anesthesia Evaluation    History of Anesthesia Complications Negative for: history of anesthetic complications  Airway Mallampati: I  TM Distance: >3 FB Neck ROM: Full    Dental  (+) Teeth Intact   Pulmonary neg pulmonary ROS,    breath sounds clear to auscultation       Cardiovascular Exercise Tolerance: Good negative cardio ROS   Rhythm:Regular Rate:Normal     Neuro/Psych PSYCHIATRIC DISORDERS Anxiety negative neurological ROS     GI/Hepatic negative GI ROS, (+)     substance abuse  marijuana use,   Endo/Other  negative endocrine ROS  Renal/GU negative Renal ROS     Musculoskeletal Multiple lacerations left upper extremity   Abdominal   Peds  Hematology negative hematology ROS (+)   Anesthesia Other Findings   Reproductive/Obstetrics negative OB ROS                          BP Readings from Last 3 Encounters:  02/19/16 104/73  07/10/13 131/83   Lab Results  Component Value Date   WBC 6.9 02/19/2016   HGB 12.2 02/19/2016   HCT 36.0 02/19/2016   MCV 86.7 02/19/2016   PLT 246 02/19/2016     Chemistry      Component Value Date/Time   NA 140 02/19/2016 1105   K 3.5 02/19/2016 1105   CL 105 02/19/2016 1105   BUN 10 02/19/2016 1105   CREATININE 0.50 02/19/2016 1105   No results found for: CALCIUM, ALKPHOS, AST, ALT, BILITOT   Lab Results  Component Value Date   PREGTESTUR  05/12/2010    NEGATIVE        THE SENSITIVITY OF THIS METHODOLOGY IS >24 mIU/mL   HCG <5.0 02/19/2016    Anesthesia Physical Anesthesia Plan  ASA: I  Anesthesia Plan: General   Post-op Pain Management:    Induction: Intravenous  Airway Management Planned: LMA  Additional Equipment:   Intra-op Plan:   Post-operative Plan: Extubation in OR  Informed Consent: I have reviewed the patients History and Physical, chart, labs and discussed the procedure including the risks, benefits and  alternatives for the proposed anesthesia with the patient or authorized representative who has indicated his/her understanding and acceptance.   Dental advisory given  Plan Discussed with: CRNA  Anesthesia Plan Comments:         Anesthesia Quick Evaluation

## 2016-02-19 NOTE — Anesthesia Procedure Notes (Signed)
Procedure Name: LMA Insertion Date/Time: 02/19/2016 4:29 PM Performed by: Geraldo Docker Pre-anesthesia Checklist: Patient identified, Emergency Drugs available, Suction available and Patient being monitored Patient Re-evaluated:Patient Re-evaluated prior to inductionOxygen Delivery Method: Circle System Utilized Preoxygenation: Pre-oxygenation with 100% oxygen Intubation Type: IV induction Ventilation: Mask ventilation without difficulty LMA: LMA inserted LMA Size: 4.0 Number of attempts: 1 Airway Equipment and Method: Bite block Placement Confirmation: positive ETCO2 and breath sounds checked- equal and bilateral Tube secured with: Tape Dental Injury: Teeth and Oropharynx as per pre-operative assessment

## 2016-02-19 NOTE — Op Note (Signed)
See [id number] Heidi Pea MD

## 2016-02-19 NOTE — H&P (Signed)
Heidi Fritz is an 25 y.o. female.   Chief Complaint: Multiple soft tissue lacerations left upper extremity with tendon dysfunction about the middle finger HPI: Injury today involving glass from a car. She has multiple lacerations tendon laceration is noted about the middle finger and profuse bleeding.  She stabilized present  She is scheduled for surgical invention given the severe disarray of soft tissues after my examination. She denies other complaints. This was an accidental injury.  Patient presents for evaluation and treatment of the of their upper extremity predicament. The patient denies neck, back, chest or  abdominal pain. The patient notes that they have no lower extremity problems. The patients primary complaint is noted. We are planning surgical care pathway for the upper extremity.  No past medical history on file.  No past surgical history on file.  No family history on file. Social History:  reports that she has never smoked. She does not have any smokeless tobacco history on file. She reports that she drinks alcohol. Her drug history is not on file.  Allergies: No Known Allergies   (Not in a hospital admission)  Results for orders placed or performed during the hospital encounter of 02/19/16 (from the past 48 hour(s))  CBC with Differential/Platelet     Status: Abnormal   Collection Time: 02/19/16 10:52 AM  Result Value Ref Range   WBC 6.9 4.0 - 10.5 K/uL   RBC 4.13 3.87 - 5.11 MIL/uL   Hemoglobin 12.2 12.0 - 15.0 g/dL   HCT 88.2 (L) 80.0 - 34.9 %   MCV 86.7 78.0 - 100.0 fL   MCH 29.5 26.0 - 34.0 pg   MCHC 34.1 30.0 - 36.0 g/dL   RDW 17.9 15.0 - 56.9 %   Platelets 246 150 - 400 K/uL   Neutrophils Relative % 74 %   Neutro Abs 5.1 1.7 - 7.7 K/uL   Lymphocytes Relative 19 %   Lymphs Abs 1.3 0.7 - 4.0 K/uL   Monocytes Relative 7 %   Monocytes Absolute 0.5 0.1 - 1.0 K/uL   Eosinophils Relative 0 %   Eosinophils Absolute 0.0 0.0 - 0.7 K/uL   Basophils  Relative 0 %   Basophils Absolute 0.0 0.0 - 0.1 K/uL  I-Stat Beta hCG blood, ED ([facility], [facility], [facility] only)     Status: None   Collection Time: 02/19/16 11:03 AM  Result Value Ref Range   I-stat hCG, quantitative <5.0 <5 mIU/mL   Comment 3            Comment:   GEST. AGE      CONC.  (mIU/mL)   <=1 WEEK        5 - 50     2 WEEKS       50 - 500     3 WEEKS       100 - 10,000     4 WEEKS     1,000 - 30,000        FEMALE AND NON-PREGNANT FEMALE:     LESS THAN 5 mIU/mL   I-stat chem 8, ed     Status: Abnormal   Collection Time: 02/19/16 11:05 AM  Result Value Ref Range   Sodium 140 135 - 145 mmol/L   Potassium 3.5 3.5 - 5.1 mmol/L   Chloride 105 101 - 111 mmol/L   BUN 10 6 - 20 mg/dL   Creatinine, Ser 7.94 0.44 - 1.00 mg/dL   Glucose, Bld 801 (H) 65 - 99 mg/dL  Calcium, Ion 1.17 1.13 - 1.30 mmol/L   TCO2 22 0 - 100 mmol/L   Hemoglobin 12.2 12.0 - 15.0 g/dL   HCT 21.3 08.6 - 57.8 %   Dg Hand 2 View Left  Result Date: 02/19/2016 CLINICAL DATA:  The patient's left hand went through a storm glass this morning result in lacerations. Pain. Initial encounter. EXAM: LEFT HAND - 2 VIEW COMPARISON:  None. FINDINGS: The ring and little fingers are bandaged and flexed limiting the study. No acute bony or joint abnormality is seen. No radiopaque foreign body is identified. IMPRESSION: Limited study.  Negative for fracture foreign body. Electronically Signed   By: Drusilla Kanner M.D.   On: 02/19/2016 09:43   Review of Systems  Eyes: Negative.   Respiratory: Negative.   Cardiovascular: Negative.   Gastrointestinal: Negative.   Genitourinary: Negative.   Skin: Negative.   Neurological: Negative.   Psychiatric/Behavioral: Negative.     Blood pressure 104/73, pulse 80, temperature 97.9 F (36.6 C), temperature source Oral, resp. rate 24, SpO2 98 %. Physical Exam multiple lacerations left volar radial forearm, left thumb, left palm. She has flexor tendon injury to the middle finger rule out adjacent  tissue injury. She appears to be sensate but is difficult to examine due to her pain level. The thumb has tremendous soft tissue abnormality is difficult for her to flexion extension due to pain. She appears to have intact refill.  No evidence of compartment syndrome at present time.  I discussed her these issues at length and the findings.  The patient is alert and oriented in no acute distress. The patient complains of pain in the affected upper extremity.  The patient is noted to have a normal HEENT exam. Lung fields show equal chest expansion and no shortness of breath. Abdomen exam is nontender without distention. Lower extremity examination does not show any fracture dislocation or blood clot symptoms. Pelvis is stable and the neck and back are stable and nontender.  Assessment/Plan Multiple lacerations with severe bleeding and disarray the soft tissues about the left forearm, left thumb, right middle finger with flexor tendon injury. Will plan for irrigation debridement and repair structures as necessary. Patient understands risk and benefits and desires to proceed.  I discussed her meaningful expectations and challenges in regards to her upper extremity. We will proceed ASAP  We are planning surgery for your upper extremity. The risk and benefits of surgery to include risk of bleeding, infection, anesthesia,  damage to normal structures and failure of the surgery to accomplish its intended goals of relieving symptoms and restoring function have been discussed in detail. With this in mind we plan to proceed. I have specifically discussed with the patient the pre-and postoperative regime and the dos and don'ts and risk and benefits in great detail. Risk and benefits of surgery also include risk of dystrophy(CRPS), chronic nerve pain, failure of the healing process to go onto completion and other inherent risks of surgery The relavent the pathophysiology of the disease/injury process, as  well as the alternatives for treatment and postoperative course of action has been discussed in great detail with the patient who desires to proceed.  We will do everything in our power to help you (the patient) restore function to the upper extremity. It is a pleasure to see this patient today.  Karen Chafe, MD 02/19/2016, 2:05 PM

## 2016-02-19 NOTE — ED Provider Notes (Signed)
[facility]ENCY DEPT Provider Note   CSN: [id number] Arrival date & time: 02/19/16  6333  First Provider Contact:  First MD Initiated Contact with Patient 02/19/16 0848        History   Chief Complaint Chief Complaint  Patient presents with  . Extremity Laceration    HPI Heidi Fritz is a 25 y.o. female.  Patient states she's unable to feel or move her middle finger on the left hand also has severe pain in the left thumb   The history is provided by the patient.  Laceration   The incident occurred less than 1 hour ago. The laceration is located on the left hand. Size: multiple lacs over the left hand and wrist. The laceration mechanism was a broken glass (she was trying to open a door and got angry and when she pushed the door her hand went through the glass). The pain is at a severity of 10/10. The pain is severe. The pain has been constant since onset. Foreign body present: unknown if there is glass in the wound but she states there was broken glass everywhere. Her tetanus status is unknown.    No past medical history on file.  There are no active problems to display for this patient.   No past surgical history on file.  OB History    No data available       Home Medications    Prior to Admission medications   Medication Sig Start Date End Date Taking? Authorizing Provider  chlorpheniramine-HYDROcodone (TUSSIONEX PENNKINETIC ER) 10-8 MG/5ML LQCR Take 5 mLs by mouth every 12 (twelve) hours as needed for cough. 07/10/13   Hayden Rasmussen, NP  ondansetron (ZOFRAN) 4 MG tablet Take 1 tablet (4 mg total) by mouth every 6 (six) hours. 07/10/13   Hayden Rasmussen, NP    Family History No family history on file.  Social History Social History  Substance Use Topics  . Smoking status: Never Smoker  . Smokeless tobacco: Not on file  . Alcohol use Yes     Allergies   Review of patient's allergies indicates no known allergies.   Review of Systems Review of Systems  All  other systems reviewed and are negative.    Physical Exam Updated Vital Signs BP 139/90 (BP Location: Right Arm)   Pulse 116   Temp 97.9 F (36.6 C) (Oral)   Resp 20   SpO2 98%   Physical Exam  Constitutional: She is oriented to person, place, and time. She appears well-developed and well-nourished. She appears distressed.  Crying and hyperventilating  HENT:  Head: Normocephalic and atraumatic.  Eyes: EOM are normal. Pupils are equal, round, and reactive to light.  Cardiovascular: Regular rhythm and intact distal pulses.  Tachycardia present.   Pulmonary/Chest: Effort normal.  Musculoskeletal:       Arms:      Hands: Left hand patient is able to flex and extend normally the second fourth and fifth digits. No lacerations to the palm.   Neurological: She is alert and oriented to person, place, and time.  Skin: Skin is warm. Capillary refill takes less than 2 seconds.  Psychiatric: Her mood appears anxious.  Nursing note and vitals reviewed.    ED Treatments / Results  Labs (all labs ordered are listed, but only abnormal results are displayed) Labs Reviewed - No data to display  EKG  EKG Interpretation None       Radiology Dg Hand 2 View Left  Result Date: 02/19/2016 CLINICAL DATA:  The patient's left hand went through a storm glass this morning result in lacerations. Pain. Initial encounter. EXAM: LEFT HAND - 2 VIEW COMPARISON:  None. FINDINGS: The ring and little fingers are bandaged and flexed limiting the study. No acute bony or joint abnormality is seen. No radiopaque foreign body is identified. IMPRESSION: Limited study.  Negative for fracture foreign body. Electronically Signed   By: Drusilla Kanner M.D.   On: 02/19/2016 09:43   Procedures Procedures (including critical care time)  Medications Ordered in ED Medications  Tdap (BOOSTRIX) injection 0.5 mL (not administered)  LORazepam (ATIVAN) injection 1 mg (1 mg Intravenous Given 02/19/16 0901)  ondansetron  (ZOFRAN) injection 4 mg (4 mg Intravenous Given 02/19/16 0901)     Initial Impression / Assessment and Plan / ED Course  I have reviewed the triage vital signs and the nursing notes.  Pertinent labs & imaging results that were available during my care of the patient were reviewed by me and considered in my medical decision making (see chart for details).  Clinical Course   Patient is a healthy 25 year old female presenting today with laceration to the left hand with concern for tendon injury. She is unable to flex her left third digit and has no sensation to that digit. There is a deep laceration at the MCP joint which is concerning for tendon and nerve laceration. She also has deep multiple lacerations to the left thumb but does have sensation as well as some flexion and extension. Difficult to completely examine because patient is tearful and screaming in pain. She does have superficial lacerations to the left volar wrist without significant deep injury. Fingers to 4 and 5 are not affected. No palmar lacerations. Tetanus updated and patient nothing by mouth since last night. Patient was given pain and anxiety control. Imaging pending to rule out foreign body. Consult hand surgery. 10:30 AM Imaging neg for FB waiting on hand surgery for eval  Final Clinical Impressions(s) / ED Diagnoses   Final diagnoses:  Laceration of third finger of left hand with tendon, initial encounter  Thumb laceration, left, initial encounter    New Prescriptions New Prescriptions   No medications on file     [name], MD 02/19/16 1049

## 2016-02-20 ENCOUNTER — Encounter (HOSPITAL_COMMUNITY): Payer: Self-pay

## 2016-02-20 MED ORDER — ALPRAZOLAM 0.5 MG PO TABS: 0.5000 mg | ORAL_TABLET | Freq: Three times a day (TID) | ORAL | 0 refills | Status: DC | PRN

## 2016-02-20 MED ORDER — CEPHALEXIN 500 MG PO CAPS: 500.0000 mg | ORAL_CAPSULE | Freq: Four times a day (QID) | ORAL | 0 refills | Status: DC

## 2016-02-20 MED ORDER — OXYCODONE HCL 5 MG PO TABS: 5.0000 mg | ORAL_TABLET | ORAL | 0 refills | Status: DC | PRN

## 2016-02-20 NOTE — Discharge Summary (Signed)
  Patient has been seen and examined. Patient has pain appropriate to his injury/process. Patient denies new complaints at this present time. I have discussed the care pathway with nursing staff. Patient is appropriate and alert.  We reviewed vital signs and intake output which are stable.  The upper extremity is neurovascularly intact. Refill is normal. There is no signs of compartment syndrome. There is no signs of dystrophy. There is normal sensation.  I have spent a  great deal of time discussing range of motion edema control and other techniques to decrease edema We have also discussed immobilization to appropriate areas involved.  We have discussed with the patient shoulder range of motion to prevent adhesive capsulitis.  The remainder of the examination is normal today without complicating feature.  Drain was removed without difficulty  Patient will be discharged home. Will plan to see the patient back in the office as per discharge instructions (please see discharge instructions).  Patient had an uneventful hospital course. At the time of discharge patient is stable awake alert and oriented in no acute distress. Regular diet will be continued and has been tolerated. Patient will notify should have problems occur. There is no signs of DVT infection or other complication at this juncture.  All questions have been incurred and answered.  Please see discharge med list Final diagnosis status post reconstruction left hand including tendon nerve and soft tissue  Epic was having technical difficulties does prescriptions were written in the form of Keflex 500 4 times a day 10 days and OxyIR 5 mg dispensed #50 take 1-2 every 6 hours as needed for pain she was also given a prescription for Xanax due to her significant anxiety issues  I discussed her we'll see her back in the office in 7 days and range therapy to [facility]. Amanda Pea MD

## 2016-02-20 NOTE — Op Note (Signed)
NAMEMarland Fritz  Heidi, Fritz NO.:  [id number]  MEDICAL RECORD NO.:  [id number]  LOCATION:  [id number]                        FACILITY:  [facility]  PHYSICIAN:  Dionne Ano. Jerrie Schussler, M.D.DATE OF BIRTH:  [birth year]  DATE OF PROCEDURE: DATE OF DISCHARGE:                              OPERATIVE REPORT   PREOPERATIVE DIAGNOSES: 1. Glass laceration to the left forearm x2 separate areas,     approximately 6 cm in length x2. 2. Laceration, thumb, segmental in nature with open fracture and     significant soft tissue disarray as well as nail bed injury and     large volar flap, segmental in nature about the thumb. 3. Left index finger laceration, segmental in nature with radial     digital nerve injury and open volar metacarpophalangeal joint with     small fracture and first dorsal interosseous laceration. 4. Left middle finger laceration with zone 2 flexor digitorum     profundus and superficialis tendon lacerations, rule out nerve     injury.  POSTOPERATIVE DIAGNOSES: 1. Glass laceration to the left forearm x2 separate areas,     approximately 6 cm in length x2. 2. Laceration, thumb, segmental in nature with open fracture and     significant soft tissue disarray as well as nail bed injury and     large volar flap, segmental in nature about the thumb. 3. Left index finger laceration, segmental in nature with radial     digital nerve injury and open volar metacarpophalangeal joint with     small fracture and first dorsal interosseous laceration. 4. Left middle finger laceration with zone 2 flexor digitorum     profundus and superficialis tendon lacerations, rule out nerve     injury.  PROCEDURES: 1. Irrigation and debridement of skin and subcutaneous tissue, left     forearm x12 cm x 1 cm.  This was an excisional debridement with     curette, knife blade and scissor. 2. Complex closure, 12-cm wound, left forearm (two 6-cm wounds,     longitudinal in nature were closed). 3. Left  thumb irrigation and debridement of skin, subcutaneous tissue,     bone, tendon and muscle.  This was an excisional debridement with     curette, scissor and knife blade. 4. Excision portion of radial sesamoid, left thumb secondary to open     fracture. 5. Repair of abductor pollicis longus tendon, left thumb. 6. Complex flap repair, left thumb x3 cm. 7. Nail bed repair, left thumb. 8. Rotation, flap advancement, left thumb distal pulp with primary     closure. 9. Radial digital nerve neurolysis, left thumb. 10.Left index finger irrigation and debridement, open     metacarpophalangeal joint with excision of bony fragments about the     metacarpophalangeal joint base. 11.First dorsal interosseous tendon repair, left index finger. 12.Radial digital nerve repair, left index finger utilizing primary     repair with a NeuraGen wrap. 13.Exploration of radial and ulnar digital nerves at a separate     location distally in the middle finger with advanced neurolysis and     intact tendon apparatus and nerves noted. 14.A 3-cm wound closure, left index finger.  15.Left middle finger irrigation and debridement of skin, subcutaneous     tissue and tendon tissue.  This was an excisional debridement with     curette, knife, blade and scissor. 16.Zone 2 repair of flexor digitorum profundus, left middle finger. 17.Zone 2 flexor digitorum superficialis repair, left middle finger. 18.Radial digital nerve and ulnar digital nerve neurolysis, left     middle finger.  SURGEON:  Dionne Ano. Amanda Pea, M.D.  ASSISTANT:  None.  COMPLICATIONS:  None.  ANESTHESIA:  General.  TOURNIQUET TIME:  Just at 2 hours.  INDICATIONS:  A 25 year old female with devastating injury to her and she had multiple lacerations from glass.  The injury was described in her chart.  I have counseled in regard to urgent I and D and repair of structures as necessary.  She understands risks and benefits and desires to proceed.  All  questions have been encouraged and answered preoperatively.  OPERATIVE PROCEDURE:  The patient was seen by myself and Anesthesia, taken to the operative theater and underwent smooth induction of general anesthetic.  I removed her bandage and placed the tourniquet, irrigated and then performed a Hibiclens/chlorhexidine pre-scrub followed by 10 minutes surgical Betadine scrub and paint.  Time-out was called. Sterile field was secured.  Arm was elevated, tourniquet was insufflated.  Following this, we performed irrigation and debridement of multiple areas, I and D of the volar forearm was accomplished.  This was a 12-cm laceration (two separate 6-cm laceration, thecal 12-cm in length). Irrigation and debridement was accomplished with curette, knife blade, and scissor.  This was an excisional debridement.  The skin and subcu were involved.  There was no deeper structure involvement.  Following this, I turned attention towards the thumb where we performed irrigation and debridement of an open flap tear x2 separate locations, one about the pulp, which involved the nail bed, one about the volar portion of the time, which involved simply the thenar region.  She had a fractured sesamoid and an open area with bone encroachment as well as abductor pollicis brevis injury.  I performed I and D of skin, subcutaneous tissue, bone and the tendon tissue and this was accomplished without difficulty.  Following this, I then performed copious amounts of irrigant into all of these wounds.  At this time, we then performed an additional I and D of the index finger.  The index finger had an open volar plate injury with bony trauma and tendon trauma.  The first dorsal interosseous was injured. Distal to this, the patient had an open wound about the proximal phalanx volar region and into the middle phalanx.  These were explored.  Radial and ulnar digital nerve neurolysis was accomplished and these were  noted to be intact.  The flexor sheath was violated, but the flexor tendons were intact.  There was no evidence of infection or dystrophy.  The patient had no evidence of bony derangement.  The patient had a significant soft tissue disarray however.  Following this, we then irrigated and debrided the middle finger, but there was obvious injury to the zone 2 flexor tendons, the bone was intact.  The 3-4 L of saline through cysto tubing were placed through all of these wounds and all of the separate debridements about the thumb, index and middle finger as well as the volar forearm were excisional debridements with curette, knife blade, and scissor.  The patient tolerated this well.  Following this, we set out for repairs.  Volar form had a 6-cm complex repair,  performed with combination of Prolene and chromic suture.  This was a total length of 12 cm in the complex repair.  Following this, the patient had repair of the nail bed.  This was nail bed repair about the left thumb followed by repair of the pulp tissue.  At this time, a rotation flap was placed over the pulp tissue and sewn into place without difficulty leaving a good skin bridge for vascular inflow.  This was a rotation flap without difficulty about the pulp to achieve primary coverage.  Following this, I then performed radial digital nerve neurolysis through a different laceration more towards the adductor pollicis injury.  The radial digital nerve underwent a neurolysis without difficulty and it was intact without complicating feature.  Although, it did have some contusion to it.  Following this, I then performed sesamoid excision of the fractured sesamoid and excised this.  Following this, we performed adductor pollicis brevis tendon repair without difficulty.  This was done with FiberWire suture.  Following this, I then turned attention towards the index finger.  The index finger underwent removal of bony shards  from the volar MCP joint and open treatment of the first metacarpal fracture in this fashion.  Following this, I then repaired the first dorsal interosseous tendon with a FiberWire suture of the 3-0 variety.  Following this, I then presoaked a NeuraGen tube and then coapted the radial digital nerve, which was completely lacerated with 9-0 nylon. Epineural repair was accomplished under 4.0 loupe magnification without difficulty and following this, I then wrapped it with a NeuraGen tube 3 mm in length, which had been presoaked.  This wound was ultimately primarily closed without difficulty.  Distal to this area of radial digital nerve repair over the MCP joint, there was also laceration including portions of the volar P1 and P2 regions.  These were explored and radial digital nerve and ulnar digital nerve neurolysis were accomplished as well as noting the tendon sheath to be violated, but intact.  There were no complicating features.  Following this, we then irrigated copiously and closed these distal wounds x2.  These were distal wounds 4 cm in length, which were closed about the index finger itself.  Following this, the middle finger underwent irrigation again followed by retrieval of the FDP and FDS tendons.  A zone 2 FDP and FDS repair was accomplished with modified K[employer]ler to [employer] suture.  The FDP had a 4- strand repair with 6-0 Prolene, epitendinous suture placed and the sublimis had repair with 2-strand repair times both the radial and ulnar slips.  I did imbricate the repairs about the slips of the FDS with Prolene suture.  The radial and ulnar digital nerves were neurolysed and underwent evaluation were intact.  These wounds were irrigated and ultimately closed.  I did make a small counterincision in the palm to retrieve the tendons as they were well proximal in terms of their traction level.  Following this, we deflated the tourniquet, irrigated once again and made sure  all wounds were closed nicely.  She had soft compartments, good refill, no complicating feature, flexor traction check with palpation and pressure against the volar forearm showed a good cascade to the fingers and I was pleased.  I then placed her in Adaptic, Xeroform gauze, 4x4s, Kerlix, and a dorsal blocking splint with volar support.  She tolerated this well.  She was extubated, taken to the recovery room. All sponge, needle and instrument counts were reported as correct.  She will  be admitted for IV antibiotics and general postoperative observation.  Should any problems occur, will be immediately available; otherwise, we look forward to participate in her postop care with the [facility] program and lined this up through [facility] of course. Do's and don'ts have been discussed and all questions have been encouraged and answered.  I would give her variable prognosis given the severity of her injury.     Dionne Ano. Amanda Pea, M.D.     [facility]  D:  02/19/2016  T:  02/20/2016  Job:  [id number]

## 2016-02-20 NOTE — Discharge Instructions (Signed)
Keep bandage clean and dry.  Call for any problems.  No smoking.  Criteria for driving a car: you should be off your pain medicine for 7-8 hours, able to drive one handed(confident), thinking clearly and feeling able in your judgement to drive. Continue elevation as it will decrease swelling.  Do not move your fingers within the confines of the bandage/splint.  Use ice if instructed by your MD. Call immediately for any sudden loss of feeling in your hand/arm or change in functional abilities of the extremity.We recommend that you to take vitamin C 1000 mg a day to promote healing. We also recommend that if you require  pain medicine that you take a stool softener to prevent constipation as most pain medicines will have constipation side effects. We recommend either Peri-Colace or Senokot and recommend that you also consider adding MiraLAX as well to prevent the constipation affects from pain medicine if you are required to use them. These medicines are over the counter and may be purchased at a local pharmacy. A cup of yogurt and a probiotic can also be helpful during the recovery process as the medicines can disrupt your intestinal environment.

## 2016-02-22 ENCOUNTER — Encounter (HOSPITAL_COMMUNITY): Payer: Self-pay | Admitting: Orthopedic Surgery

## 2016-03-02 ENCOUNTER — Ambulatory Visit: Payer: Self-pay | Attending: Orthopedic Surgery | Admitting: Occupational Therapy

## 2016-03-02 DIAGNOSIS — M6281 Muscle weakness (generalized): Secondary | ICD-10-CM

## 2016-03-02 DIAGNOSIS — R208 Other disturbances of skin sensation: Secondary | ICD-10-CM

## 2016-03-02 DIAGNOSIS — M25632 Stiffness of left wrist, not elsewhere classified: Secondary | ICD-10-CM

## 2016-03-02 DIAGNOSIS — M25642 Stiffness of left hand, not elsewhere classified: Secondary | ICD-10-CM

## 2016-03-02 DIAGNOSIS — M79642 Pain in left hand: Secondary | ICD-10-CM

## 2016-03-02 DIAGNOSIS — R600 Localized edema: Secondary | ICD-10-CM

## 2016-03-02 NOTE — Patient Instructions (Signed)
WEARING SCHEDULE:  Wear splint at ALL times except for hygiene care (May remove splint for exercises and bathing then immediately place back on ONLY if directed by the therapist)  PURPOSE:  To prevent movement and for protection until injury can heal  CARE OF SPLINT:  Keep splint away from heat sources including: stove, radiator or furnace, or a car in sunlight. The splint can melt and will no longer fit you properly  Keep away from pets and children  Clean the splint with rubbing alcohol 1-2 times per day.  * During this time, make sure you also clean your hand/arm as instructed by your therapist and/or perform dressing changes as needed. Then dry hand/arm completely before replacing splint. (When cleaning hand/arm, keep it immobilized in same position until splint is replaced)  PRECAUTIONS/POTENTIAL PROBLEMS: *If you notice or experience increased pain, swelling, numbness, or a lingering reddened area from the splint: Contact your therapist immediately by calling [phone]. You must wear the splint for protection, but we will get you scheduled for adjustments as quickly as possible.  (If only straps or hooks need to be replaced and NO adjustments to the splint need to be made, just call the office ahead and let them know you are coming in)  If you have any medical concerns or signs of infection, please call your doctor immediately

## 2016-03-03 ENCOUNTER — Ambulatory Visit: Payer: Self-pay | Admitting: Occupational Therapy

## 2016-03-03 DIAGNOSIS — M25642 Stiffness of left hand, not elsewhere classified: Secondary | ICD-10-CM

## 2016-03-03 NOTE — Patient Instructions (Signed)
  DO BELOW EX'S WITH SPLINT - KEEP FINGERS STILL  MP Flexion (Active)   Bend thumb to touch base of little finger, keeping tip joint straight. Repeat __10-15__ times. Do _3___ sessions per day.       IP Flexion (Active Blocked)   Brace thumb below tip joint. Bend joint as far as possible. Repeat __10__ times. Do _3__ sessions per day.   Composite Extension (Active)   Bring thumb out to side (like a "5") - do NOT bend fingers like picture, ONLY thumb Repeat __10-15__ times. Do _3__ sessions per day.

## 2016-03-03 NOTE — Therapy (Signed)
[facility] [address] [place], [place], [address] Phone: [phone]   Fax:  [phone]  Occupational Therapy Evaluation  Patient Details  Name: Heidi Fritz MRN: [id number] Date of Birth: 08/09/1990 Referring Provider: Dr. Amanda PeaGramig  Encounter Date: 03/02/2016      OT End of Session - 03/02/16 1357    Visit Number 1   Number of Visits 25   Date for OT Re-Evaluation 05/31/16   Authorization Type selfpay   OT Start Time 1139   OT Stop Time 1323   OT Time Calculation (min) 104 min   Activity Tolerance Patient tolerated treatment well      Past Medical History:  Diagnosis Date  . Anxiety     Past Surgical History:  Procedure Laterality Date  . I&D EXTREMITY Left 02/19/2016   Procedure: IRRIGATION AND DEBRIDEMENT  AND REPAIR AS NECESSARY ARM , WRIST;  Surgeon: Dominica SeverinWilliam Gramig, MD;  Location: [facility] OR;  Service: Orthopedics;  Laterality: Left;  . NERVE REPAIR Left 02/19/2016   Procedure: NERVE REPAIR;  Surgeon: Dominica SeverinWilliam Gramig, MD;  [facility];  Service: Orthopedics;  Laterality: Left;  . NO PAST SURGERIES    . TENDON REPAIR Left 02/19/2016   Procedure: TENDON REPAIR;  Surgeon: Dominica SeverinWilliam Gramig, MD;  [facility];  Service: Orthopedics;  Laterality: Left;    There were no vitals filed for this visit.      Subjective Assessment - 03/02/16 1351    Subjective  Pt s/p LUE injury when she cut her arm on a glass window, and sustained zone II flexor tendon injury, radial digital  n. injury and thumb degloving   Pertinent History see epic   Patient Stated Goals regain use of her dominant hand   Currently in Pain? Yes   Pain Score 8   with movement   Pain Location Hand   Pain Orientation Left   Pain Descriptors / Indicators Aching;Sharp   Pain Type Surgical pain   Pain Onset 1 to 4 weeks ago   Pain Frequency Constant   Aggravating Factors  exercises   Pain Relieving Factors inactivity   Effect of Pain on  Daily Activities limits sleep   Multiple Pain Sites No           [facility] OT Assessment - 03/03/16 0001      Assessment   Diagnosis left middle finger FDP/FDS Zone II, radial digital n. repair index finger and thumb degloving injury   Referring Provider Dr. Amanda PeaGramig   Onset Date 02/19/16     Precautions   Precautions Other (comment)   Precaution Comments Follow Zone II Duran protocol (passive flexion/ active extension in splint) cleared for A/ROM and P/ROM to thumb, wear dorsal block splint at all times except exercise and hygeine,    Required Braces or Orthoses Other Brace/Splint   Other Brace/Splint Dorsal block, per Dr. Amanda PeaGramig v.o. for wrist in neutral MP's flexed and IP's extended, (Do not follow written prescription for MP extension.)  Dry dressings x 2 weeks then may apply neosporin/ lotion.     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 Pt has prn assist from family. (Her house is just behind her mother's house.     Mobility   Mobility Status Independent     Written Expression   Dominant Hand Left   Handwriting --  unable     Sensation   Light Touch Impaired by gross assessment  for left hand     Coordination   Fine Motor Movements are Fluid and Coordinated No   Coordination impaired LUE limited by pain     Edema   Edema Pt with moderate edema in left hand. Stitches were removed today from incisions on thumb index and middle fingers  and hand. no s/s of infection present     ROM / Strength   AROM / PROM / Strength AROM     AROM   Overall AROM  Deficits;Unable to assess;Due to precautions  did not assess thumb due to time constraints.                  OT Treatments/Exercises (OP) - 03/03/16 0001      Splinting   Splinting Pt arrived unprotected. (with hand dressed only in gauze) Pt's hand was cleaned with soap and water and dressed with finger and hand stockinette. Pt was fitted with a dorsal block splint with wrist in neutral and MP's in approximately 60* flexion , IP's extended. Pt was educated in initial HEP for P/ROM flexion and A/ROM extension within splint as per Duran protocol. Pt/ mother verbalized understanding.                 OT Short Term Goals - 03/03/16 1610      OT SHORT TERM GOAL #1   Title I with splint wear, care and precautions following 1-2 weeks of wear to ensure proper fit.   Time 6   Period Weeks   Status New     OT SHORT TERM GOAL #2   Title I with inital HEP.   Time 6   Period Weeks   Status New     OT SHORT TERM GOAL #3   Title Pt will demonstrate thumb IP and MP flexion WFLS for ADLS.   Time 6   Period Weeks   Status New     OT SHORT TERM GOAL #4   Title Pt will verbalize understanding of scar massage   Time 6   Period Weeks   Status New           OT Long Term Goals - 03/03/16 0754      OT LONG TERM GOAL #1   Title I with updated HEP.   Time 12   Period Weeks   Status New     OT LONG TERM GOAL #2   Title Pt will demonstrate LUE grip strength of at least 30 lbs for increased functional use   Time 12   Period Weeks   Status New     OT LONG TERM GOAL #3   Title Pt will resume use of LUE as dominant hand at least 90% of the time with pain less than or equal to 3/10.   Time 12   Period Weeks   Status New     OT LONG TERM GOAL #4   Title  Pt will demonstrate tip and lateral pinch with LUE no less than 2 lbs of RUE measurement, for  increased functional use.   Time 12   Period Weeks   Status New               Plan - 03/02/16 1724    Clinical Impression Statement Pt s/p surgical reapir of left middle finger FDP and FDS repair Zone II, radial digital n. repair left index finger and thumb degloving injury after cutting her LUE on a storm door 02/19/16 by Dr. Amanda Pea. Pt presents with significant pain , decreased ROM and strength which impedes performance of ADLs/ IADLs. Pt can benefit from skilled occupational therapy to maximize safety and indpendence with ADLs/ IADLs. 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   Rehab Potential Good   OT Frequency 2x / week  plus eval   OT Duration 12 weeks   OT Treatment/Interventions Self-care/ADL training;Moist Heat;Fluidtherapy;DME and/or AE instruction;Splinting;Patient/family education;Therapeutic exercises;Contrast Bath;Ultrasound;Therapeutic exercise;Scar mobilization;Therapeutic activities;Passive range of motion;Neuromuscular education;Cryotherapy;Electrical Stimulation;Parrafin;Energy conservation;Manual Therapy;Visual/perceptual remediation/compensation   Plan progress per Zone II Duran protocol with (P/ROM flexion and active extension within splint), splint check and modifications PRN, A/ROM exercises for thumb   Consulted and Agree with Plan of Care Patient;Family member/caregiver   Family Member Consulted mother      Patient will benefit from skilled therapeutic intervention in order to improve the following deficits and impairments:  Decreased coordination, Decreased range of motion, Impaired flexibility, Impaired sensation, Increased edema, Decreased safety awareness, Decreased endurance, Decreased activity tolerance, Decreased knowledge of precautions, Decreased skin integrity, Impaired UE functional use, Pain, Decreased scar mobility, Decreased strength  Visit Diagnosis: Pain in left hand - Plan: Ot plan of care  cert/re-cert  Stiffness of left hand, not elsewhere classified - Plan: Ot plan of care cert/re-cert  Stiffness of left wrist, not elsewhere classified - Plan: Ot plan of care cert/re-cert  Muscle weakness (generalized) - Plan: Ot plan of care cert/re-cert  Localized edema - Plan: Ot plan of care cert/re-cert  Other disturbances of skin sensation - Plan: Ot plan of care cert/re-cert    Problem List Patient Active Problem List   Diagnosis Date Noted  . Flexor tendon laceration, hand, open wound 02/19/2016    RINE,KATHRYN 03/03/2016, 8:14 AM  [facility] [address] [place], [place], [address] Phone: [phone]   Fax:  [phone]  Name: Heidi Fritz MRN: [id number] Date of Birth: 1990/12/04

## 2016-03-03 NOTE — Therapy (Signed)
[facility] [address] [place], [place], [address] Phone: [phone]   Fax:  [phone]  Occupational Therapy Treatment  Patient Details  Name: Heidi Fritz MRN: [id number] Date of Birth: 09/30/1990 Referring Provider: Dr. Amanda PeaGramig  Encounter Date: 03/03/2016      OT End of Session - 03/03/16 1455    Visit Number 2   Number of Visits 25   Date for OT Re-Evaluation 05/31/16   Authorization Type selfpay   OT Start Time 1400   OT Stop Time 1445   OT Time Calculation (min) 45 min   Activity Tolerance Patient tolerated treatment well      Past Medical History:  Diagnosis Date  . Anxiety     Past Surgical History:  Procedure Laterality Date  . I&D EXTREMITY Left 02/19/2016   Procedure: IRRIGATION AND DEBRIDEMENT  AND REPAIR AS NECESSARY ARM , WRIST;  Surgeon: Dominica SeverinWilliam Gramig, MD;  [facility];  Service: Orthopedics;  Laterality: Left;  . NERVE REPAIR Left 02/19/2016   Procedure: NERVE REPAIR;  Surgeon: Dominica SeverinWilliam Gramig, MD;  [facility];  Service: Orthopedics;  Laterality: Left;  . NO PAST SURGERIES    . TENDON REPAIR Left 02/19/2016   Procedure: TENDON REPAIR;  Surgeon: Dominica SeverinWilliam Gramig, MD;  [facility];  Service: Orthopedics;  Laterality: Left;    There were no vitals filed for this visit.      Subjective Assessment - 03/03/16 1411    Subjective  I took the pain meds today. If it wasn't for the pain meds, I would be in a lot of pain   Pertinent History 02/19/16 flexor tendon repair   Patient Stated Goals regain use of her dominant hand   Currently in Pain? No/denies            [facility] OT Assessment - 03/03/16 1500      Left Hand AROM   L Thumb MCP 0-60 46 Degrees   L Thumb IP 0-80 35 Degrees   L Thumb Radial ADduction/ABduction 0-55 60   L Thumb Palmar ADduction/ABduction 0-45 45                  OT Treatments/Exercises (OP) - 03/03/16 1455      Exercises   Exercises Hand      Hand Exercises   Other Hand Exercises Reviewed passive flexion and active extension of fingers (digits 2-5) within confines of splint per Modified Duran protocol.    Other Hand Exercises Pt issued thumb A/ROM HEP but cautioned to do only in splint while keeping fingers immobolized. Pt verbalized understanding and return demo of ex's     Splinting   Splinting Minor adjustments to splint and added small bar dorsally at proxmial phalanges to achieve greater PIP extension in DBS.                 OT Education - 03/03/16 1424    Education provided Yes   Education Details Thumb A/ROM HEP   Person(s) Educated Patient   Methods Explanation;Demonstration;Handout   Comprehension Verbalized understanding;Returned demonstration          OT Short Term Goals - 03/03/16 0752      OT SHORT TERM GOAL #1   Title I with splint wear, care and precautions following 1-2 weeks of wear to ensure proper fit.   Time 6   Period Weeks   Status New     OT SHORT TERM GOAL #2   Title I with inital  HEP.   Time 6   Period Weeks   Status New     OT SHORT TERM GOAL #3   Title Pt will demonstrate thumb IP and MP flexion WFLS for ADLS.   Time 6   Period Weeks   Status New     OT SHORT TERM GOAL #4   Title Pt will verbalize understanding of scar massage   Time 6   Period Weeks   Status New           OT Long Term Goals - 03/03/16 0754      OT LONG TERM GOAL #1   Title I with updated HEP.   Time 12   Period Weeks   Status New     OT LONG TERM GOAL #2   Title Pt will demonstrate LUE grip strength of at least 30 lbs for increased functional use   Time 12   Period Weeks   Status New     OT LONG TERM GOAL #3   Title Pt will resume use of LUE as dominant hand at least 90% of the time with pain less than or equal to 3/10.   Time 12   Period Weeks   Status New     OT LONG TERM GOAL #4   Title Pt will demonstrate tip and lateral pinch with LUE no less than 2 lbs of RUE measurement,  for increased functional use.   Time 12   Period Weeks   Status New               Plan - 03/03/16 1504    Clinical Impression Statement Pt has taken pain meds today and was able to tolerate full active extension up to dorsal block splint (DBS) per protocol. Pt overall doing much better   Rehab Potential Good   OT Frequency 2x / week   OT Duration 12 weeks   OT Treatment/Interventions Self-care/ADL training;Moist Heat;Fluidtherapy;DME and/or AE instruction;Splinting;Patient/family education;Therapeutic exercises;Contrast Bath;Ultrasound;Therapeutic exercise;Scar mobilization;Therapeutic activities;Passive range of motion;Neuromuscular education;Cryotherapy;Electrical Stimulation;Parrafin;Energy conservation;Manual Therapy;Visual/perceptual remediation/compensation   Plan reduced frequency to 1x/wk only for next 2 weeks secondary to unable to progress protocol initially, will resume 2x/wk at 4 weeks post-op   Consulted and Agree with Plan of Care Patient      Patient will benefit from skilled therapeutic intervention in order to improve the following deficits and impairments:  Decreased coordination, Decreased range of motion, Impaired flexibility, Impaired sensation, Increased edema, Decreased safety awareness, Decreased endurance, Decreased activity tolerance, Decreased knowledge of precautions, Decreased skin integrity, Impaired UE functional use, Pain, Decreased scar mobility, Decreased strength  Visit Diagnosis: Stiffness of left hand, not elsewhere classified    Problem List Patient Active Problem List   Diagnosis Date Noted  . Flexor tendon laceration, hand, open wound 02/19/2016    Kelli Churn, OTR/L 03/03/2016, 3:06 PM   [facility] [address] [place], [place], [address] Phone: [phone]   Fax:  [phone]  Name: Heidi Fritz MRN: [id number] Date of Birth: 01/27/1991

## 2016-03-07 ENCOUNTER — Encounter: Payer: Self-pay | Admitting: *Deleted

## 2016-03-09 ENCOUNTER — Ambulatory Visit: Payer: Self-pay | Admitting: Occupational Therapy

## 2016-03-09 DIAGNOSIS — M25632 Stiffness of left wrist, not elsewhere classified: Secondary | ICD-10-CM

## 2016-03-09 DIAGNOSIS — M6281 Muscle weakness (generalized): Secondary | ICD-10-CM

## 2016-03-09 DIAGNOSIS — M79642 Pain in left hand: Secondary | ICD-10-CM

## 2016-03-09 DIAGNOSIS — R600 Localized edema: Secondary | ICD-10-CM

## 2016-03-09 DIAGNOSIS — R208 Other disturbances of skin sensation: Secondary | ICD-10-CM

## 2016-03-09 DIAGNOSIS — M25642 Stiffness of left hand, not elsewhere classified: Secondary | ICD-10-CM

## 2016-03-09 NOTE — Therapy (Signed)
[facility] [address] [place], [place], [address] Phone: [phone]   Fax:  [phone]  Occupational Therapy Treatment  Patient Details  Name: Heidi Fritz Acklin MRN: [id number] Date of Birth: 02/07/1991 Referring Provider: Dr. Amanda PeaGramig  Encounter Date: 03/09/2016      OT End of Session - 03/09/16 1350    Visit Number 3   Authorization Type selfpay   OT Start Time 1148   OT Stop Time 1230   OT Time Calculation (min) 42 min   Activity Tolerance Patient tolerated treatment well   Behavior During Therapy [facility] for tasks assessed/performed      Past Medical History:  Diagnosis Date  . Anxiety     Past Surgical History:  Procedure Laterality Date  . I&D EXTREMITY Left 02/19/2016   Procedure: IRRIGATION AND DEBRIDEMENT  AND REPAIR AS NECESSARY ARM , WRIST;  Surgeon: Dominica SeverinWilliam Gramig, MD;  Location: [facility] OR;  Service: Orthopedics;  Laterality: Left;  . NERVE REPAIR Left 02/19/2016   Procedure: NERVE REPAIR;  Surgeon: Dominica SeverinWilliam Gramig, MD;  [facility];  Service: Orthopedics;  Laterality: Left;  . NO PAST SURGERIES    . TENDON REPAIR Left 02/19/2016   Procedure: TENDON REPAIR;  Surgeon: Dominica SeverinWilliam Gramig, MD;  [facility];  Service: Orthopedics;  Laterality: Left;    There were no vitals filed for this visit.      Subjective Assessment - 03/09/16 1346    Subjective  Pt reports she picked the skin off her thumb   Pertinent History 02/19/16 flexor tendon repair   Patient Stated Goals regain use of her dominant hand   Currently in Pain? Yes   Pain Score 4    Pain Location Hand   Pain Orientation Left   Pain Descriptors / Indicators Aching;Sharp   Pain Type Surgical pain   Pain Onset 1 to 4 weeks ago   Pain Frequency Intermittent   Aggravating Factors  exercises   Pain Relieving Factors inactivity   Multiple Pain Sites No        Pt arrived wearing protective splint. Splint and dressing was removed. Hand was  washed with soap and water and dried thoroughly. Pt's thumb index finger, and middle finger were dressed with stockinette.  Pt's incsions appear to be healing well without s/s of infection. Pt has pink coloration at thumb that appears to be new healing skin. (Pt reports that she pickced the dead skin off) Pt was instructed in s/s of infection and instructed to call MD if noted. She verbalized understanding. Therapist  reviewed HEP for thumb A/ROM with pt and instructed her to assist with RUE for gentle passive stretch for exercises to thumb. Therapist reviewed the P/ROM flexion /active extension exercises within splint  For digits with pt She returned demonstration.                        OT Short Term Goals - 03/03/16 [id number]      OT SHORT TERM GOAL #1   Title I with splint wear, care and precautions following 1-2 weeks of wear to ensure proper fit.   Time 6   Period Weeks   Status New     OT SHORT TERM GOAL #2   Title I with inital HEP.   Time 6   Period Weeks   Status New     OT SHORT TERM GOAL #3   Title Pt will demonstrate thumb IP and MP flexion WFLS for  ADLS.   Time 6   Period Weeks   Status New     OT SHORT TERM GOAL #4   Title Pt will verbalize understanding of scar massage   Time 6   Period Weeks   Status New           OT Long Term Goals - 03/03/16 0754      OT LONG TERM GOAL #1   Title I with updated HEP.   Time 12   Period Weeks   Status New     OT LONG TERM GOAL #2   Title Pt will demonstrate LUE grip strength of at least 30 lbs for increased functional use   Time 12   Period Weeks   Status New     OT LONG TERM GOAL #3   Title Pt will resume use of LUE as dominant hand at least 90% of the time with pain less than or equal to 3/10.   Time 12   Period Weeks   Status New     OT LONG TERM GOAL #4   Title Pt will demonstrate tip and lateral pinch with LUE no less than 2 lbs of RUE measurement, for increased functional use.   Time 12    Period Weeks   Status New               Plan - 03/09/16 1347    Clinical Impression Statement Pt did not take pain meds today but overall pain and ROM are much better. Pt has mild pink coloration at left thumb, that appears to be new healing skin. Pt was instructed to monitor closely for s/s of infection and to call MD if noted.   Rehab Potential Good   OT Frequency 2x / week   OT Duration 12 weeks   OT Treatment/Interventions Self-care/ADL training;Moist Heat;Fluidtherapy;DME and/or AE instruction;Splinting;Patient/family education;Therapeutic exercises;Contrast Bath;Ultrasound;Therapeutic exercise;Scar mobilization;Therapeutic activities;Passive range of motion;Neuromuscular education;Cryotherapy;Electrical Stimulation;Parrafin;Energy conservation;Manual Therapy;Visual/perceptual remediation/compensation   Plan continue to progress per Zone II Duran flexor tendon protocol   Consulted and Agree with Plan of Care Patient      Patient will benefit from skilled therapeutic intervention in order to improve the following deficits and impairments:  Decreased coordination, Decreased range of motion, Impaired flexibility, Impaired sensation, Increased edema, Decreased safety awareness, Decreased endurance, Decreased activity tolerance, Decreased knowledge of precautions, Decreased skin integrity, Impaired UE functional use, Pain, Decreased scar mobility, Decreased strength  Visit Diagnosis: Stiffness of left hand, not elsewhere classified  Pain in left hand  Stiffness of left wrist, not elsewhere classified  Muscle weakness (generalized)  Localized edema  Other disturbances of skin sensation    Problem List Patient Active Problem List   Diagnosis Date Noted  . Flexor tendon laceration, hand, open wound 02/19/2016    Heidi Fritz 03/09/2016, 1:50 PM  [facility] [address] [place], [place], [address] Phone:  [phone]   Fax:  [phone]  Name: Heidi Fritz Chartrand MRN: [id number] Date of Birth: 09/06/1990

## 2016-03-09 NOTE — Patient Instructions (Signed)
Monitor skin on thumb for increased redness, if it stays the same you do not need to call Dr. Amanda PeaGramig, just follow up with him at your appt. Monday.  If your thumb becomes redder, swollen, has drainage, or warmth, call Dr. Amanda PeaGramig, even if it is on the weekend.  Make sure to finish taking antibiotics as prescribed.

## 2016-03-16 ENCOUNTER — Encounter: Payer: Self-pay | Admitting: Occupational Therapy

## 2016-03-17 ENCOUNTER — Ambulatory Visit: Payer: Self-pay | Admitting: Occupational Therapy

## 2016-03-17 DIAGNOSIS — M79642 Pain in left hand: Secondary | ICD-10-CM

## 2016-03-17 DIAGNOSIS — M25642 Stiffness of left hand, not elsewhere classified: Secondary | ICD-10-CM

## 2016-03-17 NOTE — Therapy (Signed)
[facility] [address] [place], [place], [address] Phone: [phone]   Fax:  [phone]  Occupational Therapy Treatment  Patient Details  Name: Heidi Fritz MRN: [id number] Date of Birth: [birth year] Referring Provider: Dr. Amedeo Plenty  Encounter Date: 03/17/2016      OT End of Session - 03/17/16 1603    Visit Number 4   Number of Visits 25   Date for OT Re-Evaluation 05/31/16   Authorization Type selfpay   OT Start Time 1530   OT Stop Time 1600   OT Time Calculation (min) 30 min   Activity Tolerance Patient tolerated treatment well      Past Medical History:  Diagnosis Date  . Anxiety     Past Surgical History:  Procedure Laterality Date  . I&D EXTREMITY Left 02/19/2016   Procedure: IRRIGATION AND DEBRIDEMENT  AND REPAIR AS NECESSARY ARM , WRIST;  Surgeon: Roseanne Kaufman, MD;  Location: [place];  Service: Orthopedics;  Laterality: Left;  . NERVE REPAIR Left 02/19/2016   Procedure: NERVE REPAIR;  Surgeon: Roseanne Kaufman, MD;  Location: [place];  Service: Orthopedics;  Laterality: Left;  . NO PAST SURGERIES    . TENDON REPAIR Left 02/19/2016   Procedure: TENDON REPAIR;  Surgeon: Roseanne Kaufman, MD;  Location: [place];  Service: Orthopedics;  Laterality: Left;    There were no vitals filed for this visit.      Subjective Assessment - 03/17/16 1531    Subjective  I've been working my thumb and doing my ex's. I also saw Dr. Amedeo Plenty earlier this week and he showed me scar massage.    Pertinent History 02/19/16 flexor tendon repair   Patient Stated Goals regain use of her dominant hand   Currently in Pain? Yes   Pain Score 4    Pain Location Hand   Pain Orientation Left   Pain Descriptors / Indicators Aching   Pain Type Surgical pain   Pain Onset 1 to 4 weeks ago   Pain Frequency Intermittent   Aggravating Factors  exercises   Pain Relieving Factors pain meds                      OT  Treatments/Exercises (OP) - 03/17/16 0001      ADLs   ADL Comments Pt instructed in scar massage and demo     Hand Exercises   Other Hand Exercises Added active flexion within confines of splint per 3 1/2 week post op protocol. Pt demo with limited ability Lt long finger, therefore modified to include place and hold (see pt instructions for details)                 OT Education - 03/17/16 1549    Education provided Yes   Education Details additional exercise (per 3 1/2 week protocol) and scar massage   Person(s) Educated Patient   Methods Explanation;Demonstration;Handout   Comprehension Verbalized understanding;Returned demonstration          OT Short Term Goals - 03/17/16 1603      OT SHORT TERM GOAL #1   Title I with splint wear, care and precautions following 1-2 weeks of wear to ensure proper fit.   Time 6   Period Weeks   Status Achieved     OT SHORT TERM GOAL #2   Title I with inital HEP.   Time 6   Period Weeks   Status Achieved     OT SHORT TERM GOAL #  3   Title Pt will demonstrate thumb IP and MP flexion WFLS for ADLS.   Time 6   Period Weeks   Status On-going     OT SHORT TERM GOAL #4   Title Pt will verbalize understanding of scar massage   Time 6   Period Weeks   Status Achieved           OT Long Term Goals - 03/03/16 0754      OT LONG TERM GOAL #1   Title I with updated HEP.   Time 12   Period Weeks   Status New     OT LONG TERM GOAL #2   Title Pt will demonstrate LUE grip strength of at least 30 lbs for increased functional use   Time 12   Period Weeks   Status New     OT LONG TERM GOAL #3   Title Pt will resume use of LUE as dominant hand at least 90% of the time with pain less than or equal to 3/10.   Time 12   Period Weeks   Status New     OT LONG TERM GOAL #4   Title Pt will demonstrate tip and lateral pinch with LUE no less than 2 lbs of RUE measurement, for increased functional use.   Time 12   Period Weeks    Status New               Plan - 03/17/16 1604    Clinical Impression Statement Pt met all STG's (except STG #3 ongoing). Pt demo good P/ROM of Lt long finger. Limited active flexion at IP joints long finger when performing active composite flexion inside splint.    Rehab Potential Good   OT Frequency 2x / week   OT Duration 12 weeks   OT Treatment/Interventions Self-care/ADL training;Moist Heat;Fluidtherapy;DME and/or AE instruction;Splinting;Patient/family education;Therapeutic exercises;Contrast Bath;Ultrasound;Therapeutic exercise;Scar mobilization;Therapeutic activities;Passive range of motion;Neuromuscular education;Cryotherapy;Electrical Stimulation;Parrafin;Energy conservation;Manual Therapy;Visual/perceptual remediation/compensation   Plan ultrasound prn for scar management, estim, continue A/ROM inside splint following 4 week post-op at next visit  (following visit on 03/24/16 - begin A/ROM outside splint)       Patient will benefit from skilled therapeutic intervention in order to improve the following deficits and impairments:  Decreased coordination, Decreased range of motion, Impaired flexibility, Impaired sensation, Increased edema, Decreased safety awareness, Decreased endurance, Decreased activity tolerance, Decreased knowledge of precautions, Decreased skin integrity, Impaired UE functional use, Pain, Decreased scar mobility, Decreased strength  Visit Diagnosis: Stiffness of left hand, not elsewhere classified  Pain in left hand    Problem List Patient Active Problem List   Diagnosis Date Noted  . Flexor tendon laceration, hand, open wound 02/19/2016    Carey Bullocks, OTR/L 03/17/2016, 4:08 PM  [facility] [address] [place], [place], [address] Phone: [phone]   Fax:  [phone]  Name: Heidi Fritz MRN: [id number] Date of Birth: 08-Jun-1991

## 2016-03-17 NOTE — Patient Instructions (Signed)
  SCAR MASSAGE:   Deep circles along incisions for 5 minutes, 2x/day using Vitamin E cream. DO SCAR MASSAGE IN SPLINT   In addition to previous exercises:   Now, make gentle fist while IN splint, use other hand to push further, hold new position, and then straighten fingers back up to back of splint. Do NOT grip forcefully  Continue thumb exercises

## 2016-03-22 ENCOUNTER — Ambulatory Visit: Payer: Self-pay | Admitting: Occupational Therapy

## 2016-03-22 DIAGNOSIS — M25632 Stiffness of left wrist, not elsewhere classified: Secondary | ICD-10-CM

## 2016-03-22 DIAGNOSIS — M25642 Stiffness of left hand, not elsewhere classified: Secondary | ICD-10-CM

## 2016-03-22 DIAGNOSIS — M79642 Pain in left hand: Secondary | ICD-10-CM

## 2016-03-22 DIAGNOSIS — M6281 Muscle weakness (generalized): Secondary | ICD-10-CM

## 2016-03-22 DIAGNOSIS — R600 Localized edema: Secondary | ICD-10-CM

## 2016-03-22 NOTE — Therapy (Signed)
[facility] [address] [place], [place], [address] Phone: [phone]   Fax:  [phone]  Occupational Therapy Treatment  Patient Details  Name: Heidi Fritz MRN: [id number] Date of Birth: 19-May-1991 Referring Provider: Dr. Amanda Pea  Encounter Date: 03/22/2016      OT End of Session - 03/22/16 1540    Visit Number 5   Number of Visits 25   Date for OT Re-Evaluation 05/31/16   Authorization Type selfpay   OT Start Time 1451   OT Stop Time 1531   OT Time Calculation (min) 40 min   Activity Tolerance Patient tolerated treatment well   Behavior During Therapy [facility] for tasks assessed/performed      Past Medical History:  Diagnosis Date  . Anxiety     Past Surgical History:  Procedure Laterality Date  . I&D EXTREMITY Left 02/19/2016   Procedure: IRRIGATION AND DEBRIDEMENT  AND REPAIR AS NECESSARY ARM , WRIST;  Surgeon: Dominica Severin, MD;  Location: [facility] OR;  Service: Orthopedics;  Laterality: Left;  . NERVE REPAIR Left 02/19/2016   Procedure: NERVE REPAIR;  Surgeon: Dominica Severin, MD;  Location: [facility] OR;  Service: Orthopedics;  Laterality: Left;  . NO PAST SURGERIES    . TENDON REPAIR Left 02/19/2016   Procedure: TENDON REPAIR;  Surgeon: Dominica Severin, MD;  Location: [facility] OR;  Service: Orthopedics;  Laterality: Left;    There were no vitals filed for this visit.                    OT Treatments/Exercises (OP) - 03/22/16 0001      Hand Exercises   Other Hand Exercises  active flexion within confines of splint per 3 1/2 week post op protocol. Pt demo with limited ability Lt long finger, with pt performing to include place and hold      Modalities   Modalities Electrical Stimulation;Ultrasound     Programme researcher, broadcasting/film/video Location wirst and finger flexors   Electrical Stimulation Action flexion   Electrical Stimulation Parameters 50 pps, 250 pw, 10 secs cycle x 10 mins   Electrical Stimulation Goals Neuromuscular facilitation  performed within splint, pt performing AA/ROM and place and hold during on cycle     Ultrasound   Ultrasound Location left palm, ring , middle fingers and thumb/ webspace   Ultrasound Parameters ., 0.8 w/cm2, 50 % x 8 mins   Ultrasound Goals Other (Comment)  scar mobilization     Splinting   Splinting Replaced strapping/ velcro                  OT Short Term Goals - 03/17/16 1603      OT SHORT TERM GOAL #1   Title I with splint wear, care and precautions following 1-2 weeks of wear to ensure proper fit.   Time 6   Period Weeks   Status Achieved     OT SHORT TERM GOAL #2   Title I with inital HEP.   Time 6   Period Weeks   Status Achieved     OT SHORT TERM GOAL #3   Title Pt will demonstrate thumb IP and MP flexion WFLS for ADLS.   Time 6   Period Weeks   Status On-going     OT SHORT TERM GOAL #4   Title Pt will verbalize understanding of scar massage   Time 6   Period Weeks   Status Achieved  OT Long Term Goals - 03/03/16 0754      OT LONG TERM GOAL #1   Title I with updated HEP.   Time 12   Period Weeks   Status New     OT LONG TERM GOAL #2   Title Pt will demonstrate LUE grip strength of at least 30 lbs for increased functional use   Time 12   Period Weeks   Status New     OT LONG TERM GOAL #3   Title Pt will resume use of LUE as dominant hand at least 90% of the time with pain less than or equal to 3/10.   Time 12   Period Weeks   Status New     OT LONG TERM GOAL #4   Title Pt will demonstrate tip and lateral pinch with LUE no less than 2 lbs of RUE measurement, for increased functional use.   Time 12   Period Weeks   Status New               Plan - 03/22/16 1526    Clinical Impression Statement Pt is progressing towards goals. She demonstrates improving A/ROM within the splint yet is limited for IP flexion at long finger.   Rehab Potential Good   OT  Frequency 2x / week   OT Duration 12 weeks   Plan ultrasound, estim, progress per protocol , progress to A/ROM out of splint at 4 1/2 weeks post op on 03/24/16   Consulted and Agree with Plan of Care Patient      Patient will benefit from skilled therapeutic intervention in order to improve the following deficits and impairments:  Decreased coordination, Decreased range of motion, Impaired flexibility, Impaired sensation, Increased edema, Decreased safety awareness, Decreased endurance, Decreased activity tolerance, Decreased knowledge of precautions, Decreased skin integrity, Impaired UE functional use, Pain, Decreased scar mobility, Decreased strength  Visit Diagnosis: Stiffness of left hand, not elsewhere classified  Pain in left hand  Stiffness of left wrist, not elsewhere classified  Muscle weakness (generalized)  Localized edema    Problem List Patient Active Problem List   Diagnosis Date Noted  . Flexor tendon laceration, hand, open wound 02/19/2016    RINE,KATHRYN 03/22/2016, 3:47 PM Keene BreathKathryn Rine, OTR/L [phone] Phone: [phone] 3:47 PM 03/22/16 [facility] [address] [place], [place], [address] Phone: [phone]   Fax:  [phone]  Name: Heidi Fritz MRN: [id number] Date of Birth: 01/30/1991

## 2016-03-24 ENCOUNTER — Ambulatory Visit: Payer: Self-pay | Admitting: Occupational Therapy

## 2016-03-24 DIAGNOSIS — M25632 Stiffness of left wrist, not elsewhere classified: Secondary | ICD-10-CM

## 2016-03-24 DIAGNOSIS — M79642 Pain in left hand: Secondary | ICD-10-CM

## 2016-03-24 DIAGNOSIS — M25642 Stiffness of left hand, not elsewhere classified: Secondary | ICD-10-CM

## 2016-03-24 NOTE — Patient Instructions (Signed)
   Perform below ex's outside of splint 6-8 times per day, 10-15 reps each:   1) Bend wrist and fingers down, followed by opening hand and bringing wrist back while keeping fingers straight (Do NOT use other hand to assist with opening hand or bringing wrist back)   2) Make fist, then keep fingers bent while straightening big knuckles (can help hold fingers bent with other hand if needed), then straighten fingers all the way  3) Make fist, bring wrist down and then back keeping fist

## 2016-03-24 NOTE — Therapy (Signed)
[facility] [address] [place], [place], [address] Phone: [phone]   Fax:  [phone]  Occupational Therapy Treatment  Patient Details  Name: Heidi Fritz MRN: [id number] Date of Birth: 10/10/1990 Referring Provider: Dr. Amanda PeaGramig  Encounter Date: 03/24/2016      OT End of Session - 03/24/16 1612    Visit Number 6   Number of Visits 25   Date for OT Re-Evaluation 05/31/16   Authorization Type selfpay   OT Start Time 1530   OT Stop Time 1610   OT Time Calculation (min) 40 min   Equipment Utilized During Treatment ultrasound, estim   Activity Tolerance Patient tolerated treatment well      Past Medical History:  Diagnosis Date  . Anxiety     Past Surgical History:  Procedure Laterality Date  . I&D EXTREMITY Left 02/19/2016   Procedure: IRRIGATION AND DEBRIDEMENT  AND REPAIR AS NECESSARY ARM , WRIST;  Surgeon: Dominica SeverinWilliam Gramig, MD;  Location: [facility] OR;  Service: Orthopedics;  Laterality: Left;  . NERVE REPAIR Left 02/19/2016   Procedure: NERVE REPAIR;  Surgeon: Dominica SeverinWilliam Gramig, MD;  [facility];  Service: Orthopedics;  Laterality: Left;  . NO PAST SURGERIES    . TENDON REPAIR Left 02/19/2016   Procedure: TENDON REPAIR;  Surgeon: Dominica SeverinWilliam Gramig, MD;  [facility];  Service: Orthopedics;  Laterality: Left;    There were no vitals filed for this visit.      Subjective Assessment - 03/24/16 1533    Subjective  It just feels really tight, but not really in any pain   Pertinent History 02/19/16 flexor tendon repair   Patient Stated Goals regain use of her dominant hand   Currently in Pain? No/denies                      OT Treatments/Exercises (OP) - 03/24/16 0001      Hand Exercises   Other Hand Exercises see pt instructions for details for A/ROM ex's outside splint per 4 1/2 week post op protocol     Electrical Stimulation   Electrical Stimulation Location finger flexors   Electrical  Stimulation Action flexion   Electrical Stimulation Parameters 50 pps, 250 pw, 10 sec. on/off cycle x 10 minutes     Ultrasound   Ultrasound Location Left palm around incisions   Ultrasound Parameters 3 Mhz, 20% pulsed, 0.8 wts/cm2 x 8 mins   Ultrasound Goals --  scar mobilization                OT Education - 03/24/16 1600    Education provided Yes   Education Details A/ROM HEP outside splint (per 4 1/2 week protocol)    Person(s) Educated Patient   Methods Explanation;Demonstration;Handout   Comprehension Verbalized understanding;Returned demonstration          OT Short Term Goals - 03/17/16 1603      OT SHORT TERM GOAL #1   Title I with splint wear, care and precautions following 1-2 weeks of wear to ensure proper fit.   Time 6   Period Weeks   Status Achieved     OT SHORT TERM GOAL #2   Title I with inital HEP.   Time 6   Period Weeks   Status Achieved     OT SHORT TERM GOAL #3   Title Pt will demonstrate thumb IP and MP flexion WFLS for ADLS.   Time 6   Period Weeks   Status  On-going     OT SHORT TERM GOAL #4   Title Pt will verbalize understanding of scar massage   Time 6   Period Weeks   Status Achieved           OT Long Term Goals - 03/03/16 0754      OT LONG TERM GOAL #1   Title I with updated HEP.   Time 12   Period Weeks   Status New     OT LONG TERM GOAL #2   Title Pt will demonstrate LUE grip strength of at least 30 lbs for increased functional use   Time 12   Period Weeks   Status New     OT LONG TERM GOAL #3   Title Pt will resume use of LUE as dominant hand at least 90% of the time with pain less than or equal to 3/10.   Time 12   Period Weeks   Status New     OT LONG TERM GOAL #4   Title Pt will demonstrate tip and lateral pinch with LUE no less than 2 lbs of RUE measurement, for increased functional use.   Time 12   Period Weeks   Status New               Plan - 03/24/16 1613    Clinical Impression  Statement Pt progressing with A/ROM outside splint per protocol    Rehab Potential Good   OT Frequency 2x / week   OT Duration 12 weeks   OT Treatment/Interventions Self-care/ADL training;Moist Heat;Fluidtherapy;DME and/or AE instruction;Splinting;Patient/family education;Therapeutic exercises;Contrast Bath;Ultrasound;Therapeutic exercise;Scar mobilization;Therapeutic activities;Passive range of motion;Neuromuscular education;Cryotherapy;Electrical Stimulation;Parrafin;Energy conservation;Manual Therapy;Visual/perceptual remediation/compensation   Plan continue ultrasound, estim, A/ROM outside splint, progress per protocol   Consulted and Agree with Plan of Care Patient      Patient will benefit from skilled therapeutic intervention in order to improve the following deficits and impairments:  Decreased coordination, Decreased range of motion, Impaired flexibility, Impaired sensation, Increased edema, Decreased safety awareness, Decreased endurance, Decreased activity tolerance, Decreased knowledge of precautions, Decreased skin integrity, Impaired UE functional use, Pain, Decreased scar mobility, Decreased strength  Visit Diagnosis: Stiffness of left hand, not elsewhere classified  Pain in left hand  Stiffness of left wrist, not elsewhere classified    Problem List Patient Active Problem List   Diagnosis Date Noted  . Flexor tendon laceration, hand, open wound 02/19/2016    Kelli Churn, OTR/L 03/24/2016, 4:15 PM  [place] [facility] [address] [place], [place], [address] Phone: [phone]   Fax:  [phone]  Name: Heidi Fritz MRN: [id number] Date of Birth: 1990-11-18

## 2016-03-29 ENCOUNTER — Ambulatory Visit: Payer: Self-pay | Attending: Orthopedic Surgery | Admitting: Occupational Therapy

## 2016-03-29 DIAGNOSIS — R208 Other disturbances of skin sensation: Secondary | ICD-10-CM

## 2016-03-29 DIAGNOSIS — M6281 Muscle weakness (generalized): Secondary | ICD-10-CM

## 2016-03-29 DIAGNOSIS — M79642 Pain in left hand: Secondary | ICD-10-CM

## 2016-03-29 DIAGNOSIS — M25632 Stiffness of left wrist, not elsewhere classified: Secondary | ICD-10-CM

## 2016-03-29 DIAGNOSIS — R600 Localized edema: Secondary | ICD-10-CM

## 2016-03-29 DIAGNOSIS — M25642 Stiffness of left hand, not elsewhere classified: Secondary | ICD-10-CM

## 2016-03-29 NOTE — Therapy (Signed)
[facility] [address] [place], [place], [address] Phone: [phone]   Fax:  [phone]  Occupational Therapy Treatment  Patient Details  Name: Heidi Fritz MRN: [id number] Date of Birth: Mar 02, 1991 Referring Provider: Dr. Amanda Pea  Encounter Date: 03/29/2016      OT End of Session - 03/29/16 1651    Visit Number 7   Number of Visits 25   Authorization Type selfpay   OT Start Time 1540   OT Stop Time 1625   OT Time Calculation (min) 45 min   Equipment Utilized During Treatment ultrasound, estim   Activity Tolerance Patient tolerated treatment well   Behavior During Therapy [facility] for tasks assessed/performed      Past Medical History:  Diagnosis Date  . Anxiety     Past Surgical History:  Procedure Laterality Date  . I&D EXTREMITY Left 02/19/2016   Procedure: IRRIGATION AND DEBRIDEMENT  AND REPAIR AS NECESSARY ARM , WRIST;  Surgeon: Dominica Severin, MD;  Location: [facility] OR;  Service: Orthopedics;  Laterality: Left;  . NERVE REPAIR Left 02/19/2016   Procedure: NERVE REPAIR;  Surgeon: Dominica Severin, MD;  Location: [facility] OR;  Service: Orthopedics;  Laterality: Left;  . NO PAST SURGERIES    . TENDON REPAIR Left 02/19/2016   Procedure: TENDON REPAIR;  Surgeon: Dominica Severin, MD;  Location: [facility] OR;  Service: Orthopedics;  Laterality: Left;    There were no vitals filed for this visit.      Subjective Assessment - 03/29/16 1541    Pertinent History 02/19/16 flexor tendon repair   Patient Stated Goals regain use of her dominant hand   Currently in Pain? Yes   Pain Score 2    Pain Location Hand   Pain Orientation Left   Pain Descriptors / Indicators Aching   Pain Type Surgical pain   Pain Onset 1 to 4 weeks ago   Pain Frequency Intermittent   Aggravating Factors  exercises   Pain Relieving Factors pain meds   Multiple Pain Sites No         Electrical Stimulation  Electrical Stimulation Location finger  flexors  Electrical Stimulation Action flexion  Electrical Stimulation Parameters 50 pps, 250 pw, 10 sec. on/off cycle x 10 minutes    Ultrasound  Ultrasound Location Left palm around incisions  Ultrasound Parameters 3 Mhz, 50% pulsed, 0.8 wts/cm2 x 8 mins  Ultrasound Goals --  scar mobilization    Reviewed previously  issued HEP for A/ROM exercises outside of splint (see pt instructions last visit) Place and hold flexion at PIP joints, and compositely with active extension Note sent to MD with updates. Pt was tearful and discouraged regarding her limited PIP flexion. Therapist  provided reassurance.                      OT Short Term Goals - 03/17/16 1603      OT SHORT TERM GOAL #1   Title I with splint wear, care and precautions following 1-2 weeks of wear to ensure proper fit.   Time 6   Period Weeks   Status Achieved     OT SHORT TERM GOAL #2   Title I with inital HEP.   Time 6   Period Weeks   Status Achieved     OT SHORT TERM GOAL #3   Title Pt will demonstrate thumb IP and MP flexion WFLS for ADLS.   Time 6   Period Weeks   Status On-going  OT SHORT TERM GOAL #4   Title Pt will verbalize understanding of scar massage   Time 6   Period Weeks   Status Achieved           OT Long Term Goals - 03/03/16 0754      OT LONG TERM GOAL #1   Title I with updated HEP.   Time 12   Period Weeks   Status New     OT LONG TERM GOAL #2   Title Pt will demonstrate LUE grip strength of at least 30 lbs for increased functional use   Time 12   Period Weeks   Status New     OT LONG TERM GOAL #3   Title Pt will resume use of LUE as dominant hand at least 90% of the time with pain less than or equal to 3/10.   Time 12   Period Weeks   Status New     OT LONG TERM GOAL #4   Title Pt will demonstrate tip and lateral pinch with LUE no less than 2 lbs of RUE measurement, for increased functional use.   Time 12   Period Weeks   Status New                Plan - 03/29/16 1650    Clinical Impression Statement Pt is progressing towards goals. She demonstrates understanding of updated A/ROM HEP, however she is very discouraged by limited PIP flexion at middle finger.   Rehab Potential Good   OT Frequency 2x / week   OT Duration 12 weeks   OT Treatment/Interventions Self-care/ADL training;Moist Heat;Fluidtherapy;DME and/or AE instruction;Splinting;Patient/family education;Therapeutic exercises;Contrast Bath;Ultrasound;Therapeutic exercise;Scar mobilization;Therapeutic activities;Passive range of motion;Neuromuscular education;Cryotherapy;Electrical Stimulation;Parrafin;Energy conservation;Manual Therapy;Visual/perceptual remediation/compensation   Plan ultrasound, estm, A/ROM out of splint, progress per protocol   Consulted and Agree with Plan of Care Patient      Patient will benefit from skilled therapeutic intervention in order to improve the following deficits and impairments:  Decreased coordination, Decreased range of motion, Impaired flexibility, Impaired sensation, Increased edema, Decreased safety awareness, Decreased endurance, Decreased activity tolerance, Decreased knowledge of precautions, Decreased skin integrity, Impaired UE functional use, Pain, Decreased scar mobility, Decreased strength  Visit Diagnosis: Stiffness of left hand, not elsewhere classified  Pain in left hand  Stiffness of left wrist, not elsewhere classified  Muscle weakness (generalized)  Localized edema  Other disturbances of skin sensation    Problem List Patient Active Problem List   Diagnosis Date Noted  . Flexor tendon laceration, hand, open wound 02/19/2016    RINE,KATHRYN 03/29/2016, 4:55 PM Keene BreathKathryn Rine, OTR/L [phone] Phone: [phone] 4:55 PM 03/29/16 [facility] [address] [place], [place], [address] Phone: [phone]   Fax:   [phone]  Name: Heidi Fritz MRN: [id number] Date of Birth: 11/29/1990

## 2016-03-29 NOTE — Patient Instructions (Signed)
Dr. Amanda PeaGramig,   Heidi SabinaAshley Fritz is receiving occupational therapy at our site. She is progressing overall and is now performing A/ROM exercises outside of the splint as well as place and hold flexion. She demonstrates difficulty sustaining PIP flexion and this is very discouraging for her(however she has been performing exercises outside of the splint for approx. 1 week). We are using ultrasound, and estim as a part of her therapy.  She reports exercising at home 5-6 x day. When can we d/c her splint (except for busy environments)? Please feel free to contact me with any questions.  Thanks, [employer] Corporation[employer]athryn Travell Desaulniers, OTR/L

## 2016-03-31 ENCOUNTER — Ambulatory Visit: Payer: Self-pay | Admitting: Occupational Therapy

## 2016-03-31 DIAGNOSIS — M25632 Stiffness of left wrist, not elsewhere classified: Secondary | ICD-10-CM

## 2016-03-31 DIAGNOSIS — R208 Other disturbances of skin sensation: Secondary | ICD-10-CM

## 2016-03-31 DIAGNOSIS — M25642 Stiffness of left hand, not elsewhere classified: Secondary | ICD-10-CM

## 2016-03-31 DIAGNOSIS — M79642 Pain in left hand: Secondary | ICD-10-CM

## 2016-03-31 NOTE — Therapy (Signed)
[facility] [address] [place], [place], [address] Phone: [phone]   Fax:  [phone]  Occupational Therapy Treatment  Patient Details  Name: Alvis Lemmingsshley K Brandenburger MRN: [id number] Date of Birth: 07/28/1990 Referring Provider: Dr. Amanda PeaGramig  Encounter Date: 03/31/2016      OT End of Session - 03/31/16 1630    Visit Number 8   Number of Visits 25   Date for OT Re-Evaluation 05/31/16   Authorization Type selfpay   OT Start Time 1535   OT Stop Time 1630   OT Time Calculation (min) 55 min   Equipment Utilized During Treatment ultrasound, estim   Activity Tolerance Patient tolerated treatment well      Past Medical History:  Diagnosis Date  . Anxiety     Past Surgical History:  Procedure Laterality Date  . I&D EXTREMITY Left 02/19/2016   Procedure: IRRIGATION AND DEBRIDEMENT  AND REPAIR AS NECESSARY ARM , WRIST;  Surgeon: Dominica SeverinWilliam Gramig, MD;  Location: [facility] OR;  Service: Orthopedics;  Laterality: Left;  . NERVE REPAIR Left 02/19/2016   Procedure: NERVE REPAIR;  Surgeon: Dominica SeverinWilliam Gramig, MD;  [facility];  Service: Orthopedics;  Laterality: Left;  . NO PAST SURGERIES    . TENDON REPAIR Left 02/19/2016   Procedure: TENDON REPAIR;  Surgeon: Dominica SeverinWilliam Gramig, MD;  [facility];  Service: Orthopedics;  Laterality: Left;    There were no vitals filed for this visit.      Subjective Assessment - 03/31/16 1556    Pertinent History 02/19/16 flexor tendon repair   Limitations do NOT use scar extractor d/t pt's skin sensitivity   Patient Stated Goals regain use of her dominant hand   Currently in Pain? Yes   Pain Score 2    Pain Location Hand   Pain Orientation Left   Pain Descriptors / Indicators Aching   Pain Type Surgical pain   Pain Onset 1 to 4 weeks ago   Pain Frequency Intermittent   Aggravating Factors  estim   Pain Relieving Factors exercises                      OT Treatments/Exercises (OP) -  03/31/16 0001      ADLs   ADL Comments Updates from MD - continue scar massage, desensitization, A/ROM, place and hold, P/ROM. NO putty or strengthening until further notice. "ok to come out of splint during sedentary awake hours"      Hand Exercises   Other Hand Exercises Continue A/ROM, place and hold, and P/ROM in flexion exercises for digits Lt hand outside splint     Electrical Stimulation   Electrical Stimulation Location volar forearm   Electrical Stimulation Action finger flexion   Electrical Stimulation Parameters 50 pps, 250 pw, 10 sec. on/off cycle x 10 minutes with long finger buddy strapped to ring finger   Electrical Stimulation Goals Neuromuscular facilitation  ROM     Ultrasound   Ultrasound Location left palm - base of long and index fingers   Ultrasound Parameters 3 Mhz, 0.8 wts/cm2, 20% x 8 minutes   Ultrasound Goals --  scar mobilization     Manual Therapy   Manual therapy comments Scar massage along incisions for scar management. Attempted scar extractor on another area however pt with blood blister therefore d/c                  OT Short Term Goals - 03/17/16 1603      OT  SHORT TERM GOAL #1   Title I with splint wear, care and precautions following 1-2 weeks of wear to ensure proper fit.   Time 6   Period Weeks   Status Achieved     OT SHORT TERM GOAL #2   Title I with inital HEP.   Time 6   Period Weeks   Status Achieved     OT SHORT TERM GOAL #3   Title Pt will demonstrate thumb IP and MP flexion WFLS for ADLS.   Time 6   Period Weeks   Status On-going     OT SHORT TERM GOAL #4   Title Pt will verbalize understanding of scar massage   Time 6   Period Weeks   Status Achieved           OT Long Term Goals - 03/03/16 0754      OT LONG TERM GOAL #1   Title I with updated HEP.   Time 12   Period Weeks   Status New     OT LONG TERM GOAL #2   Title Pt will demonstrate LUE grip strength of at least 30 lbs for increased  functional use   Time 12   Period Weeks   Status New     OT LONG TERM GOAL #3   Title Pt will resume use of LUE as dominant hand at least 90% of the time with pain less than or equal to 3/10.   Time 12   Period Weeks   Status New     OT LONG TERM GOAL #4   Title Pt will demonstrate tip and lateral pinch with LUE no less than 2 lbs of RUE measurement, for increased functional use.   Time 12   Period Weeks   Status New               Plan - 03/31/16 1632    Clinical Impression Statement Pt progressing with protocol. Update rec'd from MD (scanned in EPIC) and in note under "ADL comments".    Rehab Potential Good   OT Frequency 2x / week   OT Duration 12 weeks   OT Treatment/Interventions Self-care/ADL training;Moist Heat;Fluidtherapy;DME and/or AE instruction;Splinting;Patient/family education;Therapeutic exercises;Contrast Bath;Ultrasound;Therapeutic exercise;Scar mobilization;Therapeutic activities;Passive range of motion;Neuromuscular education;Cryotherapy;Electrical Stimulation;Parrafin;Energy conservation;Manual Therapy;Visual/perceptual remediation/compensation   Plan continue [place], estim, begin blocking exercises and passive extension per protocol (6 weeks post-op), gradually wean splint at home   Consulted and Agree with Plan of Care Patient      Patient will benefit from skilled therapeutic intervention in order to improve the following deficits and impairments:  Decreased coordination, Decreased range of motion, Impaired flexibility, Impaired sensation, Increased edema, Decreased safety awareness, Decreased endurance, Decreased activity tolerance, Decreased knowledge of precautions, Decreased skin integrity, Impaired UE functional use, Pain, Decreased scar mobility, Decreased strength  Visit Diagnosis: Stiffness of left hand, not elsewhere classified  Pain in left hand  Stiffness of left wrist, not elsewhere classified  Other disturbances of skin  sensation    Problem List Patient Active Problem List   Diagnosis Date Noted  . Flexor tendon laceration, hand, open wound 02/19/2016    Kelli Churn, OTR/L 03/31/2016, 4:40 PM  [place] [facility] [address] [place], [place], [address] Phone: [phone]   Fax:  [phone]  Name: SAIDY ORMAND MRN: [id number] Date of Birth: 1990-09-22

## 2016-04-06 ENCOUNTER — Ambulatory Visit: Payer: Self-pay | Admitting: Occupational Therapy

## 2016-04-06 DIAGNOSIS — M25632 Stiffness of left wrist, not elsewhere classified: Secondary | ICD-10-CM

## 2016-04-06 DIAGNOSIS — M25642 Stiffness of left hand, not elsewhere classified: Secondary | ICD-10-CM

## 2016-04-06 DIAGNOSIS — R208 Other disturbances of skin sensation: Secondary | ICD-10-CM

## 2016-04-06 DIAGNOSIS — M79642 Pain in left hand: Secondary | ICD-10-CM

## 2016-04-06 NOTE — Therapy (Signed)
[facility] [address] [place], [place], [address] Phone: [phone]   Fax:  [phone]  Occupational Therapy Treatment  Patient Details  Name: Heidi Fritz MRN: [id number] Date of Birth: 1991/02/04 Referring Provider: Dr. Amanda Pea  Encounter Date: 04/06/2016      OT End of Session - 04/06/16 1728    Visit Number 9   Number of Visits 25   Date for OT Re-Evaluation 05/31/16   Authorization Type selfpay   OT Start Time 1450   OT Stop Time 1535   OT Time Calculation (min) 45 min   Activity Tolerance Patient tolerated treatment well   Behavior During Therapy [facility] for tasks assessed/performed      Past Medical History:  Diagnosis Date  . Anxiety     Past Surgical History:  Procedure Laterality Date  . I&D EXTREMITY Left 02/19/2016   Procedure: IRRIGATION AND DEBRIDEMENT  AND REPAIR AS NECESSARY ARM , WRIST;  Surgeon: Dominica Severin, MD;  Location: [facility] OR;  Service: Orthopedics;  Laterality: Left;  . NERVE REPAIR Left 02/19/2016   Procedure: NERVE REPAIR;  Surgeon: Dominica Severin, MD;  Location: [facility] OR;  Service: Orthopedics;  Laterality: Left;  . NO PAST SURGERIES    . TENDON REPAIR Left 02/19/2016   Procedure: TENDON REPAIR;  Surgeon: Dominica Severin, MD;  Location: [facility] OR;  Service: Orthopedics;  Laterality: Left;    There were no vitals filed for this visit.      Subjective Assessment - 04/06/16 1731    Subjective  It just feels really tight, but not really in any pain   Pertinent History 02/19/16 flexor tendon repair   Limitations do NOT use scar extractor d/t pt's skin sensitivity   Patient Stated Goals regain use of her dominant hand   Currently in Pain? No/denies               Hand Exercises  Other Hand Exercises Continued A/ROM, place and hold, and P/ROM in flexion exercises for digits Lt hand outside splint    Electrical Stimulation  Electrical Stimulation Location volar forearm   Electrical Stimulation Action finger flexion  Electrical Stimulation Parameters 50 pps, 250 pw, 10 sec. on/off cycle x 10 minutes with long finger buddy strapped to ring finger  Electrical Stimulation Goals Neuromuscular facilitation  ROM    Ultrasound  Ultrasound Location left palm - base of long and index fingers  Ultrasound Parameters 3 Mhz, 0.8 wts/cm2, 20% x 8 minutes     Update HEP to include PIP and DIP blocking exercises for index and long fingers and pt was instructed to perform full P/ROM finger ext.                OT Short Term Goals - 03/17/16 1603      OT SHORT TERM GOAL #1   Title I with splint wear, care and precautions following 1-2 weeks of wear to ensure proper fit.   Time 6   Period Weeks   Status Achieved     OT SHORT TERM GOAL #2   Title I with inital HEP.   Time 6   Period Weeks   Status Achieved     OT SHORT TERM GOAL #3   Title Pt will demonstrate thumb IP and MP flexion WFLS for ADLS.   Time 6   Period Weeks   Status On-going     OT SHORT TERM GOAL #4   Title Pt will verbalize understanding of scar massage  Time 6   Period Weeks   Status Achieved           OT Long Term Goals - 03/03/16 0754      OT LONG TERM GOAL #1   Title I with updated HEP.   Time 12   Period Weeks   Status New     OT LONG TERM GOAL #2   Title Pt will demonstrate LUE grip strength of at least 30 lbs for increased functional use   Time 12   Period Weeks   Status New     OT LONG TERM GOAL #3   Title Pt will resume use of LUE as dominant hand at least 90% of the time with pain less than or equal to 3/10.   Time 12   Period Weeks   Status New     OT LONG TERM GOAL #4   Title Pt will demonstrate tip and lateral pinch with LUE no less than 2 lbs of RUE measurement, for increased functional use.   Time 12   Period Weeks   Status New               Plan - 04/06/16 1724    Clinical Impression Statement Pt is progressing towards goals with  improving PIP flexion for middle finger. Pt progressed to 6 week post op exercises, she is now performing blocking and free to perform P/ROM extension.   Rehab Potential Good   OT Frequency 2x / week   OT Duration 12 weeks   OT Treatment/Interventions Self-care/ADL training;Moist Heat;Fluidtherapy;DME and/or AE instruction;Splinting;Patient/family education;Therapeutic exercises;Contrast Bath;Ultrasound;Therapeutic exercise;Scar mobilization;Therapeutic activities;Passive range of motion;Neuromuscular education;Cryotherapy;Electrical Stimulation;Parrafin;Energy conservation;Manual Therapy;Visual/perceptual remediation/compensation   Plan reinforce blocking and passive extension,    Consulted and Agree with Plan of Care Patient      Patient will benefit from skilled therapeutic intervention in order to improve the following deficits and impairments:  Decreased coordination, Decreased range of motion, Impaired flexibility, Impaired sensation, Increased edema, Decreased safety awareness, Decreased endurance, Decreased activity tolerance, Decreased knowledge of precautions, Decreased skin integrity, Impaired UE functional use, Pain, Decreased scar mobility, Decreased strength  Visit Diagnosis: Stiffness of left hand, not elsewhere classified  Pain in left hand  Stiffness of left wrist, not elsewhere classified  Other disturbances of skin sensation    Problem List Patient Active Problem List   Diagnosis Date Noted  . Flexor tendon laceration, hand, open wound 02/19/2016    Heidi Fritz 04/06/2016, 5:34 PM  [facility] [address] [place], [place], [address] Phone: [phone]   Fax:  [phone]  Name: Heidi Fritz MRN: [id number] Date of Birth: 06/07/1991

## 2016-04-08 ENCOUNTER — Ambulatory Visit: Payer: Self-pay | Admitting: Occupational Therapy

## 2016-04-08 DIAGNOSIS — M6281 Muscle weakness (generalized): Secondary | ICD-10-CM

## 2016-04-08 DIAGNOSIS — M79642 Pain in left hand: Secondary | ICD-10-CM

## 2016-04-08 DIAGNOSIS — M25642 Stiffness of left hand, not elsewhere classified: Secondary | ICD-10-CM

## 2016-04-08 DIAGNOSIS — M25632 Stiffness of left wrist, not elsewhere classified: Secondary | ICD-10-CM

## 2016-04-08 DIAGNOSIS — R208 Other disturbances of skin sensation: Secondary | ICD-10-CM

## 2016-04-08 NOTE — Therapy (Signed)
[facility] [address] [place], [place], [address] Phone: [phone]   Fax:  [phone]  Occupational Therapy Treatment  Patient Details  Name: Heidi Fritz MRN: [id number] Date of Birth: Oct 04, 1990 Referring Provider: Dr. Amanda Pea  Encounter Date: 04/08/2016      OT End of Session - 04/08/16 1557    Visit Number 10   Number of Visits 25   Date for OT Re-Evaluation 05/31/16   Authorization Type selfpay   OT Start Time 1320   OT Stop Time 1410   OT Time Calculation (min) 50 min   Activity Tolerance Patient tolerated treatment well   Behavior During Therapy [facility] for tasks assessed/performed      Past Medical History:  Diagnosis Date  . Anxiety     Past Surgical History:  Procedure Laterality Date  . I&D EXTREMITY Left 02/19/2016   Procedure: IRRIGATION AND DEBRIDEMENT  AND REPAIR AS NECESSARY ARM , WRIST;  Surgeon: Dominica Severin, MD;  Location: [facility] OR;  Service: Orthopedics;  Laterality: Left;  . NERVE REPAIR Left 02/19/2016   Procedure: NERVE REPAIR;  Surgeon: Dominica Severin, MD;  [facility] OR;  Service: Orthopedics;  Laterality: Left;  . NO PAST SURGERIES    . TENDON REPAIR Left 02/19/2016   Procedure: TENDON REPAIR;  Surgeon: Dominica Severin, MD;  Location: [facility] OR;  Service: Orthopedics;  Laterality: Left;    There were no vitals filed for this visit.      Subjective Assessment - 04/08/16 1600    Subjective  It just feels really tight, but not really in any pain   Pertinent History 02/19/16 flexor tendon repair   Limitations do NOT use scar extractor d/t pt's skin sensitivity   Patient Stated Goals regain use of her dominant hand   Currently in Pain? Yes   Pain Score 3    Pain Location Hand   Pain Orientation Left   Pain Descriptors / Indicators Aching   Pain Type Surgical pain   Pain Onset 1 to 4 weeks ago   Pain Frequency Intermittent   Aggravating Factors  estim, exercises   Pain Relieving  Factors medicine             Treatment: fluidotherapy x 10 mins to LUE, for pain relief and desensitization, no adverse reaction Hand Exercises  Other Hand Exercises Continued A/ROM, place and hold, and P/ROM in flexion exercises for digits Lt hand outside splint, including PIP and DIP blocking, and passive finger extension. Pt is unable to initate flexion at DIP for middle finger and she demonstrates limitations in PIP joint flexion and extension. Buddy strap applied to middle and ring finger to assist with AA/ROM PIP flexion. Scar massage to index and middle fingers.    Banker Location volar forearm  Electrical Stimulation Action finger flexion  Electrical Stimulation Parameters 50 pps, 250 pw, 10 sec. on/off cycle x 10 minutes with long finger buddy strapped to ring finger, int: 15  Electrical Stimulation Goals Neuromuscular facilitation  ROM    Ultrasound  Ultrasound Location left palm - and digits, base of long and index fingers  Ultrasound Parameters 3 Mhz, 0.8 wts/cm2, 50% x 8 minutes                     OT Short Term Goals - 03/17/16 1603      OT SHORT TERM GOAL #1   Title I with splint wear, care and precautions following 1-2 weeks of  wear to ensure proper fit.   Time 6   Period Weeks   Status Achieved     OT SHORT TERM GOAL #2   Title I with inital HEP.   Time 6   Period Weeks   Status Achieved     OT SHORT TERM GOAL #3   Title Pt will demonstrate thumb IP and MP flexion WFLS for ADLS.   Time 6   Period Weeks   Status On-going     OT SHORT TERM GOAL #4   Title Pt will verbalize understanding of scar massage   Time 6   Period Weeks   Status Achieved           OT Long Term Goals - 03/03/16 0754      OT LONG TERM GOAL #1   Title I with updated HEP.   Time 12   Period Weeks   Status New     OT LONG TERM GOAL #2   Title Pt will demonstrate LUE grip strength of at least 30 lbs for increased  functional use   Time 12   Period Weeks   Status New     OT LONG TERM GOAL #3   Title Pt will resume use of LUE as dominant hand at least 90% of the time with pain less than or equal to 3/10.   Time 12   Period Weeks   Status New     OT LONG TERM GOAL #4   Title Pt will demonstrate tip and lateral pinch with LUE no less than 2 lbs of RUE measurement, for increased functional use.   Time 12   Period Weeks   Status New               Plan - 04/08/16 1556    Clinical Impression Statement Pt is progressing towards goals. she demonstrates limitations in PIP flexion at the middle finger.  Pt has been educated in blocking exercises for performance at home   Rehab Potential Good   OT Frequency 2x / week   OT Duration 12 weeks   OT Treatment/Interventions Self-care/ADL training;Moist Heat;Fluidtherapy;DME and/or AE instruction;Splinting;Patient/family education;Therapeutic exercises;Contrast Bath;Ultrasound;Therapeutic exercise;Scar mobilization;Therapeutic activities;Passive range of motion;Neuromuscular education;Cryotherapy;Electrical Stimulation;Parrafin;Energy conservation;Manual Therapy;Visual/perceptual remediation/compensation   Plan progress per protocol, continue blocking exercises and passive extension   Consulted and Agree with Plan of Care Patient      Patient will benefit from skilled therapeutic intervention in order to improve the following deficits and impairments:  Decreased coordination, Decreased range of motion, Impaired flexibility, Impaired sensation, Increased edema, Decreased safety awareness, Decreased endurance, Decreased activity tolerance, Decreased knowledge of precautions, Decreased skin integrity, Impaired UE functional use, Pain, Decreased scar mobility, Decreased strength  Visit Diagnosis: Stiffness of left hand, not elsewhere classified  Pain in left hand  Stiffness of left wrist, not elsewhere classified  Other disturbances of skin  sensation  Muscle weakness (generalized)    Problem List Patient Active Problem List   Diagnosis Date Noted  . Flexor tendon laceration, hand, open wound 02/19/2016    RINE,KATHRYN 04/08/2016, 4:01 PM Keene BreathKathryn Rine, OTR/L [phone] Phone: [phone] 4:04 PM 04/08/16 [facility] [address] [place], [place], [address] Phone: [phone]   Fax:  [phone]  Name: Heidi Fritz MRN: [id number] Date of Birth: 08/14/1990

## 2016-04-12 ENCOUNTER — Ambulatory Visit: Payer: Self-pay | Admitting: Occupational Therapy

## 2016-04-12 DIAGNOSIS — M79642 Pain in left hand: Secondary | ICD-10-CM

## 2016-04-12 DIAGNOSIS — M6281 Muscle weakness (generalized): Secondary | ICD-10-CM

## 2016-04-12 DIAGNOSIS — M25632 Stiffness of left wrist, not elsewhere classified: Secondary | ICD-10-CM

## 2016-04-12 DIAGNOSIS — R208 Other disturbances of skin sensation: Secondary | ICD-10-CM

## 2016-04-12 DIAGNOSIS — M25642 Stiffness of left hand, not elsewhere classified: Secondary | ICD-10-CM

## 2016-04-12 NOTE — Therapy (Signed)
[facility] [address] [place], [place], [address] Phone: [phone]   Fax:  [phone]  Occupational Therapy Treatment  Patient Details  Name: Heidi Fritz MRN: [id number] Date of Birth: Aug 26, 1990 Referring Provider: Dr. Amanda Pea  Encounter Date: 04/12/2016      OT End of Session - 04/12/16 1404    Visit Number 11   Number of Visits 25   Date for OT Re-Evaluation 05/31/16   Authorization Type selfpay   OT Start Time 1317   OT Stop Time 1408   OT Time Calculation (min) 51 min   Activity Tolerance Patient tolerated treatment well   Behavior During Therapy [facility] for tasks assessed/performed      Past Medical History:  Diagnosis Date  . Anxiety     Past Surgical History:  Procedure Laterality Date  . I&D EXTREMITY Left 02/19/2016   Procedure: IRRIGATION AND DEBRIDEMENT  AND REPAIR AS NECESSARY ARM , WRIST;  Surgeon: Dominica Severin, MD;  Location: [facility] OR;  Service: Orthopedics;  Laterality: Left;  . NERVE REPAIR Left 02/19/2016   Procedure: NERVE REPAIR;  Surgeon: Dominica Severin, MD;  Location: [facility] OR;  Service: Orthopedics;  Laterality: Left;  . NO PAST SURGERIES    . TENDON REPAIR Left 02/19/2016   Procedure: TENDON REPAIR;  Surgeon: Dominica Severin, MD;  Location: [facility] OR;  Service: Orthopedics;  Laterality: Left;    There were no vitals filed for this visit.               Treatment: fluidotherapy x 10 mins to LUE, for pain relief and desensitization, no adverse reaction Hand Exercises  Other Hand Exercises Continued A/ROM, place and hold, and P/ROM in flexion exercises for digits Lt hand outside splint, including PIP and DIP blocking, and passive finger extension. Pt is unable to initate flexion at DIP for middle finger and she demonstrates limitations in PIP joint flexion and extension. Buddy strap applied to middle and ring finger to assist with AA/ROM PIP flexion. Scar massage to index and middle  fingers.    Banker Location volar forearm  Electrical Stimulation Action finger flexion  Electrical Stimulation Parameters 50 pps, 250 pw, 10 sec. on/off cycle x 10 minutes with long finger buddy strapped to ring finger, int: 14  Electrical Stimulation Goals Neuromuscular facilitation  ROM    Ultrasound  Ultrasound Location left palm - and 2, 3, 4th digits, base of long and index fingers  Ultrasound Parameters 3 Mhz, 0.8 wts/cm2, 50% x 8 minutes                   OT Short Term Goals - 03/17/16 1603      OT SHORT TERM GOAL #1   Title I with splint wear, care and precautions following 1-2 weeks of wear to ensure proper fit.   Time 6   Period Weeks   Status Achieved     OT SHORT TERM GOAL #2   Title I with inital HEP.   Time 6   Period Weeks   Status Achieved     OT SHORT TERM GOAL #3   Title Pt will demonstrate thumb IP and MP flexion WFLS for ADLS.   Time 6   Period Weeks   Status On-going     OT SHORT TERM GOAL #4   Title Pt will verbalize understanding of scar massage   Time 6   Period Weeks   Status Achieved  OT Long Term Goals - 03/03/16 0754      OT LONG TERM GOAL #1   Title I with updated HEP.   Time 12   Period Weeks   Status New     OT LONG TERM GOAL #2   Title Pt will demonstrate LUE grip strength of at least 30 lbs for increased functional use   Time 12   Period Weeks   Status New     OT LONG TERM GOAL #3   Title Pt will resume use of LUE as dominant hand at least 90% of the time with pain less than or equal to 3/10.   Time 12   Period Weeks   Status New     OT LONG TERM GOAL #4   Title Pt will demonstrate tip and lateral pinch with LUE no less than 2 lbs of RUE measurement, for increased functional use.   Time 12   Period Weeks   Status New               Plan - 04/12/16 1401    Clinical Impression Statement pt is progressing towards goals. she demonstrates continued  limitations in PIP joint flexion and inability to initiate DIP joint flexion   Rehab Potential Good   OT Frequency 2x / week   OT Duration 12 weeks   OT Treatment/Interventions Self-care/ADL training;Moist Heat;Fluidtherapy;DME and/or AE instruction;Splinting;Patient/family education;Therapeutic exercises;Contrast Bath;Ultrasound;Therapeutic exercise;Scar mobilization;Therapeutic activities;Passive range of motion;Neuromuscular education;Cryotherapy;Electrical Stimulation;Parrafin;Energy conservation;Manual Therapy;Visual/perceptual remediation/compensation   Plan continue per protocol, continue blocking exercises and modalities   Consulted and Agree with Plan of Care Patient   Family Member Consulted mother      Patient will benefit from skilled therapeutic intervention in order to improve the following deficits and impairments:  Decreased coordination, Decreased range of motion, Impaired flexibility, Impaired sensation, Increased edema, Decreased safety awareness, Decreased endurance, Decreased activity tolerance, Decreased knowledge of precautions, Decreased skin integrity, Impaired UE functional use, Pain, Decreased scar mobility, Decreased strength  Visit Diagnosis: No diagnosis found.    Problem List Patient Active Problem List   Diagnosis Date Noted  . Flexor tendon laceration, hand, open wound 02/19/2016    Heidi Fritz 04/12/2016, 2:06 PM   [facility] [address] [place], [place], [address] Phone: [phone]   Fax:  [phone]  Name: Heidi Fritz MRN: [id number] Date of Birth: 09/13/1990

## 2016-04-14 ENCOUNTER — Ambulatory Visit: Payer: Self-pay | Admitting: Occupational Therapy

## 2016-04-14 DIAGNOSIS — M25642 Stiffness of left hand, not elsewhere classified: Secondary | ICD-10-CM

## 2016-04-14 DIAGNOSIS — M25632 Stiffness of left wrist, not elsewhere classified: Secondary | ICD-10-CM

## 2016-04-14 DIAGNOSIS — M79642 Pain in left hand: Secondary | ICD-10-CM

## 2016-04-14 DIAGNOSIS — R208 Other disturbances of skin sensation: Secondary | ICD-10-CM

## 2016-04-14 DIAGNOSIS — M6281 Muscle weakness (generalized): Secondary | ICD-10-CM

## 2016-04-14 NOTE — Therapy (Signed)
[facility] [address] [place], [place], [address] Phone: [phone]   Fax:  [phone]  Occupational Therapy Treatment  Patient Details  Name: Heidi Fritz MRN: [id number] Date of Birth: 02-14-1991 Referring Provider: Dr. Amanda Pea  Encounter Date: 04/14/2016      OT End of Session - 04/14/16 1326    Visit Number 12   Number of Visits 25   Date for OT Re-Evaluation 05/31/16   Authorization Type selfpay   OT Start Time 1318   OT Stop Time 1400   OT Time Calculation (min) 42 min   Activity Tolerance Patient tolerated treatment well   Behavior During Therapy [facility] for tasks assessed/performed      Past Medical History:  Diagnosis Date  . Anxiety     Past Surgical History:  Procedure Laterality Date  . I&D EXTREMITY Left 02/19/2016   Procedure: IRRIGATION AND DEBRIDEMENT  AND REPAIR AS NECESSARY ARM , WRIST;  Surgeon: Dominica Severin, MD;  Location: [facility] OR;  Service: Orthopedics;  Laterality: Left;  . NERVE REPAIR Left 02/19/2016   Procedure: NERVE REPAIR;  Surgeon: Dominica Severin, MD;  Location: [facility] OR;  Service: Orthopedics;  Laterality: Left;  . NO PAST SURGERIES    . TENDON REPAIR Left 02/19/2016   Procedure: TENDON REPAIR;  Surgeon: Dominica Severin, MD;  Location: [facility] OR;  Service: Orthopedics;  Laterality: Left;    There were no vitals filed for this visit.      Subjective Assessment - 04/14/16 1324    Subjective  Pt reports pain only with exercises   Pertinent History 02/19/16 flexor tendon repair   Limitations do NOT use scar extractor d/t pt's skin sensitivity   Patient Stated Goals regain use of her dominant hand   Currently in Pain? Yes   Pain Score 3    Pain Location Hand   Pain Orientation Left   Pain Descriptors / Indicators Aching   Pain Type Surgical pain   Pain Onset 1 to 4 weeks ago   Pain Frequency Intermittent   Aggravating Factors  estim, exercises   Pain Relieving Factors medicine   Multiple Pain Sites No               Treatment: fluidotherapy x 10 mins to LUE, for pain relief and desensitization, no adverse reaction Hand Exercises  Other Hand Exercises Continued A/ROM, place and hold, and P/ROM in flexion exercises for digits Lt hand outside splint, including PIP and DIP blocking, and passive finger extension. Pt is unable to initate flexion at DIP for middle finger and she demonstrates limitations in PIP joint flexion and extension. Buddy strap applied to middle and ring finger to assist with AA/ROM PIP flexion. Rist flexion extension A/ROM/P/ROM.    Banker Location volar forearm  Electrical Stimulation Action finger flexion  Electrical Stimulation Parameters 50 pps, 250 pw, 10 sec. on/off cycle x 10 minutes with long finger buddy strapped to ring finger, int: 16  Electrical Stimulation Goals Neuromuscular facilitation  ROM    Ultrasound  Ultrasound Location left palm - and 2, 3, 4th digits, base of long and index fingers  Ultrasound Parameters 3 Mhz, 0.8 wts/cm2, 50% x 8 minutes                      OT Short Term Goals - 03/17/16 1603      OT SHORT TERM GOAL #1   Title I with splint wear, care and precautions following  1-2 weeks of wear to ensure proper fit.   Time 6   Period Weeks   Status Achieved     OT SHORT TERM GOAL #2   Title I with inital HEP.   Time 6   Period Weeks   Status Achieved     OT SHORT TERM GOAL #3   Title Pt will demonstrate thumb IP and MP flexion WFLS for ADLS.   Time 6   Period Weeks   Status On-going     OT SHORT TERM GOAL #4   Title Pt will verbalize understanding of scar massage   Time 6   Period Weeks   Status Achieved           OT Long Term Goals - 03/03/16 0754      OT LONG TERM GOAL #1   Title I with updated HEP.   Time 12   Period Weeks   Status New     OT LONG TERM GOAL #2   Title Pt will demonstrate LUE grip strength of at least 30 lbs  for increased functional use   Time 12   Period Weeks   Status New     OT LONG TERM GOAL #3   Title Pt will resume use of LUE as dominant hand at least 90% of the time with pain less than or equal to 3/10.   Time 12   Period Weeks   Status New     OT LONG TERM GOAL #4   Title Pt will demonstrate tip and lateral pinch with LUE no less than 2 lbs of RUE measurement, for increased functional use.   Time 12   Period Weeks   Status New               Plan - 04/14/16 1326    Clinical Impression Statement Pt is progressing towards goals with continued limitations in PIP and DIP flexion for middle finger.   Rehab Potential Good   OT Frequency 2x / week   OT Duration 8 weeks   OT Treatment/Interventions Self-care/ADL training;Moist Heat;Fluidtherapy;DME and/or AE instruction;Splinting;Patient/family education;Therapeutic exercises;Contrast Bath;Ultrasound;Therapeutic exercise;Scar mobilization;Therapeutic activities;Passive range of motion;Neuromuscular education;Cryotherapy;Electrical Stimulation;Parrafin;Energy conservation;Manual Therapy;Visual/perceptual remediation/compensation   Plan continue A/ROM, P/ROM , hold off on putty per MD request   Consulted and Agree with Plan of Care Patient      Patient will benefit from skilled therapeutic intervention in order to improve the following deficits and impairments:  Decreased coordination, Decreased range of motion, Impaired flexibility, Impaired sensation, Increased edema, Decreased safety awareness, Decreased endurance, Decreased activity tolerance, Decreased knowledge of precautions, Decreased skin integrity, Impaired UE functional use, Pain, Decreased scar mobility, Decreased strength  Visit Diagnosis: Stiffness of left hand, not elsewhere classified  Pain in left hand  Stiffness of left wrist, not elsewhere classified  Other disturbances of skin sensation  Muscle weakness (generalized)    Problem List Patient Active  Problem List   Diagnosis Date Noted  . Flexor tendon laceration, hand, open wound 02/19/2016    RINE,KATHRYN 04/14/2016, 4:51 PM  [facility] [address] [place], [place], [address] Phone: [phone]   Fax:  [phone]  Name: Alvis Lemmingsshley K Lyness MRN: [id number] Date of Birth: 08/16/1990

## 2016-04-25 ENCOUNTER — Ambulatory Visit: Payer: Self-pay | Attending: Orthopedic Surgery | Admitting: Occupational Therapy

## 2016-04-25 DIAGNOSIS — M79642 Pain in left hand: Secondary | ICD-10-CM

## 2016-04-25 DIAGNOSIS — M25642 Stiffness of left hand, not elsewhere classified: Secondary | ICD-10-CM

## 2016-04-25 DIAGNOSIS — M25632 Stiffness of left wrist, not elsewhere classified: Secondary | ICD-10-CM

## 2016-04-25 DIAGNOSIS — R208 Other disturbances of skin sensation: Secondary | ICD-10-CM

## 2016-04-25 DIAGNOSIS — M6281 Muscle weakness (generalized): Secondary | ICD-10-CM

## 2016-04-25 NOTE — Therapy (Signed)
[facility] [address] [place], [place], [address] Phone: [phone]   Fax:  [phone]  Occupational Therapy Treatment  Patient Details  Name: Heidi Fritz MRN: [id number] Date of Birth: Jul 23, 1991 Referring Provider: Dr. Amedeo Plenty  Encounter Date: 04/25/2016      OT End of Session - 04/25/16 1526    Visit Number 13   Number of Visits 25   Date for OT Re-Evaluation 05/31/16   Authorization Type selfpay   OT Start Time 1447   OT Stop Time 1530   OT Time Calculation (min) 43 min   Equipment Utilized During Treatment ultrasound, estim   Activity Tolerance Patient tolerated treatment well   Behavior During Therapy [facility] for tasks assessed/performed      Past Medical History:  Diagnosis Date  . Anxiety     Past Surgical History:  Procedure Laterality Date  . I&D EXTREMITY Left 02/19/2016   Procedure: IRRIGATION AND DEBRIDEMENT  AND REPAIR AS NECESSARY ARM , WRIST;  Surgeon: Roseanne Kaufman, MD;  Location: [place];  Service: Orthopedics;  Laterality: Left;  . NERVE REPAIR Left 02/19/2016   Procedure: NERVE REPAIR;  Surgeon: Roseanne Kaufman, MD;  Location: [place];  Service: Orthopedics;  Laterality: Left;  . NO PAST SURGERIES    . TENDON REPAIR Left 02/19/2016   Procedure: TENDON REPAIR;  Surgeon: Roseanne Kaufman, MD;  Location: [place];  Service: Orthopedics;  Laterality: Left;    There were no vitals filed for this visit.      Subjective Assessment - 04/25/16 1519    Subjective  Pt reports no pain with exercises   Pertinent History 02/19/16 flexor tendon repair   Limitations do NOT use scar extractor d/t pt's skin sensitivity, 04/25/16- Per Aaron Edelman pt should wear splint when out, no putty or gripping yet   Patient Stated Goals regain use of her dominant hand   Currently in Pain? No/denies                Hand Exercises  Other Hand Exercises Continued A/ROM, place and hold, and P/ROM in flexion exercises for  digits Lt hand outside splint, including PIP and DIP blocking, and passive finger extension. Pt is unable to initate flexion at DIP for middle finger and she demonstrates limitations in PIP joint flexion and extension. Buddy strap applied to middle and ring finger to assist with AA/ROM PIP flexion. Rist flexion extension A/ROM/P/ROM.    Programme researcher, broadcasting/film/video Location volar forearm  Electrical Stimulation Action finger flexion  Electrical Stimulation Parameters NMES small muscle preset x 10 minutes with long finger buddy strapped to ring finger, int: 16  Electrical Stimulation Goals Neuromuscular facilitation  ROM    Ultrasound  Ultrasound Location left palm - and 2, 3, 4th digits, base of long and index fingers  Ultrasound Parameters 3 Mhz, 0.8 wts/cm2, 20% x 8 minutes                       OT Short Term Goals - 04/25/16 1523      OT SHORT TERM GOAL #1   Title I with splint wear, care and precautions following 1-2 weeks of wear to ensure proper fit.   Time 6   Period Weeks   Status Achieved     OT SHORT TERM GOAL #2   Title I with inital HEP.   Time 6   Period Weeks   Status Achieved     OT SHORT TERM GOAL #  3   Title Pt will demonstrate thumb IP and MP flexion WFLS for ADLS.   Time 6   Period Weeks   Status Partially Met  MP 75, IP 65     OT SHORT TERM GOAL #4   Title Pt will verbalize understanding of scar massage   Time 6   Period Weeks   Status Achieved           OT Long Term Goals - 03/03/16 0754      OT LONG TERM GOAL #1   Title I with updated HEP.   Time 12   Period Weeks   Status New     OT LONG TERM GOAL #2   Title Pt will demonstrate LUE grip strength of at least 30 lbs for increased functional use   Time 12   Period Weeks   Status New     OT LONG TERM GOAL #3   Title Pt will resume use of LUE as dominant hand at least 90% of the time with pain less than or equal to 3/10.   Time 12   Period Weeks    Status New     OT LONG TERM GOAL #4   Title Pt will demonstrate tip and lateral pinch with LUE no less than 2 lbs of RUE measurement, for increased functional use.   Time 12   Period Weeks   Status New               Plan - 04/25/16 1521    Clinical Impression Statement Pt is progressing towards goals. She demonstrates improving PIP joint flexion for middle finger, however she does not demonstrate DIP joint A/ROM.   Rehab Potential Good   OT Frequency 2x / week   OT Duration 8 weeks   OT Treatment/Interventions Self-care/ADL training;Moist Heat;Fluidtherapy;DME and/or AE instruction;Splinting;Patient/family education;Therapeutic exercises;Contrast Bath;Ultrasound;Therapeutic exercise;Scar mobilization;Therapeutic activities;Passive range of motion;Neuromuscular education;Cryotherapy;Electrical Stimulation;Parrafin;Energy conservation;Manual Therapy;Visual/perceptual remediation/compensation   Plan continue a/ROM, P/ROM, place and hold, no putty until cleared by MD   Consulted and Agree with Plan of Care Patient      Patient will benefit from skilled therapeutic intervention in order to improve the following deficits and impairments:  Decreased coordination, Decreased range of motion, Impaired flexibility, Impaired sensation, Increased edema, Decreased safety awareness, Decreased endurance, Decreased activity tolerance, Decreased knowledge of precautions, Decreased skin integrity, Impaired UE functional use, Pain, Decreased scar mobility, Decreased strength  Visit Diagnosis: Stiffness of left hand, not elsewhere classified  Pain in left hand  Stiffness of left wrist, not elsewhere classified  Other disturbances of skin sensation  Muscle weakness (generalized)    Problem List Patient Active Problem List   Diagnosis Date Noted  . Flexor tendon laceration, hand, open wound 02/19/2016    RINE,KATHRYN 04/25/2016, 5:05 PM  [facility] [address] [place], [place], [address] Phone: [phone]   Fax:  [phone]  Name: Heidi Fritz MRN: [id number] Date of Birth: May 25, 1991

## 2016-04-27 ENCOUNTER — Encounter: Payer: Self-pay | Admitting: Occupational Therapy

## 2016-05-03 ENCOUNTER — Ambulatory Visit: Payer: Self-pay | Admitting: Occupational Therapy

## 2016-05-03 DIAGNOSIS — M79642 Pain in left hand: Secondary | ICD-10-CM

## 2016-05-03 DIAGNOSIS — R208 Other disturbances of skin sensation: Secondary | ICD-10-CM

## 2016-05-03 DIAGNOSIS — M6281 Muscle weakness (generalized): Secondary | ICD-10-CM

## 2016-05-03 DIAGNOSIS — M25642 Stiffness of left hand, not elsewhere classified: Secondary | ICD-10-CM

## 2016-05-03 DIAGNOSIS — M25632 Stiffness of left wrist, not elsewhere classified: Secondary | ICD-10-CM

## 2016-05-03 NOTE — Therapy (Signed)
[facility] [address] [place], [place], [address] Phone: [phone]   Fax:  [phone]  Occupational Therapy Treatment  Patient Details  Name: Heidi Fritz MRN: [id number] Date of Birth: 1991/01/26 Referring Provider: Dr. Amedeo Plenty  Encounter Date: 05/03/2016    Past Medical History:  Diagnosis Date  . Anxiety     Past Surgical History:  Procedure Laterality Date  . I&D EXTREMITY Left 02/19/2016   Procedure: IRRIGATION AND DEBRIDEMENT  AND REPAIR AS NECESSARY ARM , WRIST;  Surgeon: Roseanne Kaufman, MD;  Location: [place];  Service: Orthopedics;  Laterality: Left;  . NERVE REPAIR Left 02/19/2016   Procedure: NERVE REPAIR;  Surgeon: Roseanne Kaufman, MD;  Location: [place];  Service: Orthopedics;  Laterality: Left;  . NO PAST SURGERIES    . TENDON REPAIR Left 02/19/2016   Procedure: TENDON REPAIR;  Surgeon: Roseanne Kaufman, MD;  Location: [place];  Service: Orthopedics;  Laterality: Left;    There were no vitals filed for this visit.      Subjective Assessment - 05/03/16 1633    Subjective  "It's a little better"   Pertinent History 02/19/16 flexor tendon repair   Limitations do NOT use scar extractor d/t pt's skin sensitivity, 04/25/16- Per Aaron Edelman pt should wear splint when out, no putty or gripping yet   Patient Stated Goals regain use of her dominant hand   Currently in Pain? No/denies             Hand Exercises  Other Hand Exercises Scar massage to middle finger. Continued A/ROM, place and hold, and P/ROM in flexion exercises for digits Lt hand outside splint, including PIP  blocking, and passive finger extension. Pt is unable to initate flexion at DIP for middle finger and she demonstrates limitations in PIP joint flexion and extension. Buddy strap applied to middle and ring finger to assist with AA/ROM PIP flexion.   Pt arrived wearing splint, redness at dorsal PIP join and MP for index, foam padding added to  splint and fresh strapping    Electrical Stimulation  Electrical Stimulation Location volar forearm  Electrical Stimulation Action finger flexion  Electrical Stimulation Parameters NMES 50pps 250 pw, 10 secs cycle with long finger buddy strapped to ring finger, int: 16 x 10 mins  Electrical Stimulation Goals Neuromuscular facilitation  ROM    Ultrasound  Ultrasound Location left palm - and 2, 3,  digits, base of long and index fingers  Ultrasound Parameters 3 Mhz, 0.8 wts/cm2, 50% x 8 minutes                       OT Short Term Goals - 04/25/16 1523      OT SHORT TERM GOAL #1   Title I with splint wear, care and precautions following 1-2 weeks of wear to ensure proper fit.   Time 6   Period Weeks   Status Achieved     OT SHORT TERM GOAL #2   Title I with inital HEP.   Time 6   Period Weeks   Status Achieved     OT SHORT TERM GOAL #3   Title Pt will demonstrate thumb IP and MP flexion WFLS for ADLS.   Time 6   Period Weeks   Status Partially Met  MP 75, IP 65     OT SHORT TERM GOAL #4   Title Pt will verbalize understanding of scar massage   Time 6   Period Weeks  Status Achieved           OT Long Term Goals - 03/03/16 0754      OT LONG TERM GOAL #1   Title I with updated HEP.   Time 12   Period Weeks   Status New     OT LONG TERM GOAL #2   Title Pt will demonstrate LUE grip strength of at least 30 lbs for increased functional use   Time 12   Period Weeks   Status New     OT LONG TERM GOAL #3   Title Pt will resume use of LUE as dominant hand at least 90% of the time with pain less than or equal to 3/10.   Time 12   Period Weeks   Status New     OT LONG TERM GOAL #4   Title Pt will demonstrate tip and lateral pinch with LUE no less than 2 lbs of RUE measurement, for increased functional use.   Time 12   Period Weeks   Status New               Plan - 05/03/16 1628    Clinical Impression Statement Pt is progressing  towards goals. She remains limited in active PIP flexion for middle finger, and she does not demonstrate active DIP flexion for middle finger.   Rehab Potential Good   OT Frequency 2x / week   OT Duration 12 weeks   OT Treatment/Interventions Self-care/ADL training;Moist Heat;Fluidtherapy;DME and/or AE instruction;Splinting;Patient/family education;Therapeutic exercises;Contrast Bath;Ultrasound;Therapeutic exercise;Scar mobilization;Therapeutic activities;Passive range of motion;Neuromuscular education;Cryotherapy;Electrical Stimulation;Parrafin;Energy conservation;Manual Therapy;Visual/perceptual remediation/compensation   Plan continue A/ROM, P/ROM place and hold, no putty until cleared by MD,      Patient will benefit from skilled therapeutic intervention in order to improve the following deficits and impairments:  Decreased coordination, Decreased range of motion, Impaired flexibility, Impaired sensation, Increased edema, Decreased safety awareness, Decreased endurance, Decreased activity tolerance, Decreased knowledge of precautions, Decreased skin integrity, Impaired UE functional use, Pain, Decreased scar mobility, Decreased strength  Visit Diagnosis: Stiffness of left hand, not elsewhere classified  Pain in left hand  Stiffness of left wrist, not elsewhere classified  Other disturbances of skin sensation  Muscle weakness (generalized)    Problem List Patient Active Problem List   Diagnosis Date Noted  . Flexor tendon laceration, hand, open wound 02/19/2016    RINE,KATHRYN 05/03/2016, 4:35 PM  [facility] [address] [place], [place], [address] Phone: [phone]   Fax:  [phone]  Name: SERAPHINE GUDIEL MRN: [id number] Date of Birth: 11-Mar-1991

## 2016-05-05 ENCOUNTER — Ambulatory Visit: Payer: Self-pay | Admitting: Occupational Therapy

## 2016-05-05 DIAGNOSIS — M6281 Muscle weakness (generalized): Secondary | ICD-10-CM

## 2016-05-05 DIAGNOSIS — M25632 Stiffness of left wrist, not elsewhere classified: Secondary | ICD-10-CM

## 2016-05-05 DIAGNOSIS — M25642 Stiffness of left hand, not elsewhere classified: Secondary | ICD-10-CM

## 2016-05-05 DIAGNOSIS — M79642 Pain in left hand: Secondary | ICD-10-CM

## 2016-05-05 DIAGNOSIS — R208 Other disturbances of skin sensation: Secondary | ICD-10-CM

## 2016-05-06 NOTE — Therapy (Signed)
[facility] [address] [place], [place], [address] Phone: [phone]   Fax:  [phone]  Occupational Therapy Treatment  Patient Details  Name: MCKINZIE SAKSA MRN: [id number] Date of Birth: 25-Jul-1991 Referring Provider: Dr. Amedeo Plenty  Encounter Date: 05/05/2016      OT End of Session - 05/06/16 1258    Visit Number 15   Number of Visits 25   Date for OT Re-Evaluation 05/31/16   Authorization Type selfpay   OT Start Time 1450   OT Stop Time 1530   OT Time Calculation (min) 40 min   Activity Tolerance Patient tolerated treatment well   Behavior During Therapy [facility] for tasks assessed/performed      Past Medical History:  Diagnosis Date  . Anxiety     Past Surgical History:  Procedure Laterality Date  . I&D EXTREMITY Left 02/19/2016   Procedure: IRRIGATION AND DEBRIDEMENT  AND REPAIR AS NECESSARY ARM , WRIST;  Surgeon: Roseanne Kaufman, MD;  Location: [place];  Service: Orthopedics;  Laterality: Left;  . NERVE REPAIR Left 02/19/2016   Procedure: NERVE REPAIR;  Surgeon: Roseanne Kaufman, MD;  Location: [place];  Service: Orthopedics;  Laterality: Left;  . NO PAST SURGERIES    . TENDON REPAIR Left 02/19/2016   Procedure: TENDON REPAIR;  Surgeon: Roseanne Kaufman, MD;  Location: [place];  Service: Orthopedics;  Laterality: Left;    There were no vitals filed for this visit.      Subjective Assessment - 05/05/16 1531    Pertinent History 02/19/16 flexor tendon repair   Limitations do NOT use scar extractor d/t pt's skin sensitivity, 04/25/16- Per Aaron Edelman pt should wear splint when out, no putty or gripping yet   Patient Stated Goals regain use of her dominant hand   Currently in Pain? No/denies             Treatment:  Hand Exercises  Other Hand Exercises Scar massage to middle finger and palm Continued A/ROM, place and hold, and P/ROM in flexion exercises for digits Lt hand outside splint, including PIP  blocking,  and passive finger extension. Pt has only trace activation for  flexion at DIP for middle finger and she demonstrates limitations in PIP joint flexion and extension.    Programme researcher, broadcasting/film/video Location volar forearm  Electrical Stimulation Action finger flexion  Electrical Stimulation Parameters NMES 50pps 250 pw, 10 secs cycle with long finger buddy strapped to ring finger, int: 16 x 10 mins  Electrical Stimulation Goals Neuromuscular facilitation  ROM    Ultrasound  Ultrasound Location left palm - and 2, 3,  digits, base of long and index fingers  Ultrasound Parameters 3 Mhz, 0.8 wts/cm2, 50% x 8 minutes                      OT Short Term Goals - 04/25/16 1523      OT SHORT TERM GOAL #1   Title I with splint wear, care and precautions following 1-2 weeks of wear to ensure proper fit.   Time 6   Period Weeks   Status Achieved     OT SHORT TERM GOAL #2   Title I with inital HEP.   Time 6   Period Weeks   Status Achieved     OT SHORT TERM GOAL #3   Title Pt will demonstrate thumb IP and MP flexion WFLS for ADLS.   Time 6   Period Weeks   Status Partially  Met  MP 75, IP 65     OT SHORT TERM GOAL #4   Title Pt will verbalize understanding of scar massage   Time 6   Period Weeks   Status Achieved           OT Long Term Goals - 03/03/16 0754      OT LONG TERM GOAL #1   Title I with updated HEP.   Time 12   Period Weeks   Status New     OT LONG TERM GOAL #2   Title Pt will demonstrate LUE grip strength of at least 30 lbs for increased functional use   Time 12   Period Weeks   Status New     OT LONG TERM GOAL #3   Title Pt will resume use of LUE as dominant hand at least 90% of the time with pain less than or equal to 3/10.   Time 12   Period Weeks   Status New     OT LONG TERM GOAL #4   Title Pt will demonstrate tip and lateral pinch with LUE no less than 2 lbs of RUE measurement, for increased functional use.   Time  12   Period Weeks   Status New               Plan - 05/05/16 1531    Clinical Impression Statement Pt is progressing towards goals slowly. she demonstrates limited PIP flexion for her middle finger and only trace DIP activitation.   Rehab Potential Good   OT Frequency 2x / week   OT Duration 12 weeks   OT Treatment/Interventions Self-care/ADL training;Moist Heat;Fluidtherapy;DME and/or AE instruction;Splinting;Patient/family education;Therapeutic exercises;Contrast Bath;Ultrasound;Therapeutic exercise;Scar mobilization;Therapeutic activities;Passive range of motion;Neuromuscular education;Cryotherapy;Electrical Stimulation;Parrafin;Energy conservation;Manual Therapy;Visual/perceptual remediation/compensation   Plan A/ROM, P/ROM, place and hold, no putty until cleared by MD.   Consulted and Agree with Plan of Care Patient   Family Member Consulted mother      Patient will benefit from skilled therapeutic intervention in order to improve the following deficits and impairments:  Decreased coordination, Decreased range of motion, Impaired flexibility, Impaired sensation, Increased edema, Decreased safety awareness, Decreased endurance, Decreased activity tolerance, Decreased knowledge of precautions, Decreased skin integrity, Impaired UE functional use, Pain, Decreased scar mobility, Decreased strength  Visit Diagnosis: Stiffness of left hand, not elsewhere classified  Pain in left hand  Stiffness of left wrist, not elsewhere classified  Other disturbances of skin sensation  Muscle weakness (generalized)    Problem List Patient Active Problem List   Diagnosis Date Noted  . Flexor tendon laceration, hand, open wound 02/19/2016    RINE,KATHRYN 05/06/2016, 12:58 PM Theone Murdoch, OTR/L [phone] Phone: [phone] 1:01 PM 05/06/16 [facility] [address] [place], [place], [address] Phone:  [phone]   Fax:  [phone]  Name: SAKIA SCHRIMPF MRN: [id number] Date of Birth: Jan 26, 1991

## 2016-05-10 ENCOUNTER — Ambulatory Visit: Payer: Self-pay | Admitting: Occupational Therapy

## 2016-05-12 ENCOUNTER — Encounter: Payer: Self-pay | Admitting: Occupational Therapy

## 2016-05-17 ENCOUNTER — Ambulatory Visit: Payer: Self-pay | Admitting: Occupational Therapy

## 2016-05-17 DIAGNOSIS — M6281 Muscle weakness (generalized): Secondary | ICD-10-CM

## 2016-05-17 DIAGNOSIS — M25642 Stiffness of left hand, not elsewhere classified: Secondary | ICD-10-CM

## 2016-05-17 DIAGNOSIS — M79642 Pain in left hand: Secondary | ICD-10-CM

## 2016-05-17 DIAGNOSIS — R208 Other disturbances of skin sensation: Secondary | ICD-10-CM

## 2016-05-17 DIAGNOSIS — M25632 Stiffness of left wrist, not elsewhere classified: Secondary | ICD-10-CM

## 2016-05-17 NOTE — Patient Instructions (Signed)
1. Grip Strengthening (Resistive Putty)   Squeeze putty using thumb and all fingers. Repeat _20___ times. Do __2__ sessions per day.   2. Roll putty into tube on table and pinch between each finger and thumb x 10 reps each. (can do ring and small finger together)     Copyright  [employer]. All rights reserved.   

## 2016-05-17 NOTE — Therapy (Signed)
[facility] [address] [place], [place], [address] Phone: [phone]   Fax:  [phone]  Occupational Therapy Treatment  Patient Details  Name: Heidi Fritz MRN: [id number] Date of Birth: [birth year] Referring Provider: Dr. Amedeo Plenty  Encounter Date: 05/17/2016      OT End of Session - 05/17/16 1707    Visit Number 16   Number of Visits 25   Date for OT Re-Evaluation 05/31/16      Past Medical History:  Diagnosis Date  . Anxiety     Past Surgical History:  Procedure Laterality Date  . I&D EXTREMITY Left 02/19/2016   Procedure: IRRIGATION AND DEBRIDEMENT  AND REPAIR AS NECESSARY ARM , WRIST;  Surgeon: Roseanne Kaufman, MD;  Location: [facility];  Service: Orthopedics;  Laterality: Left;  . NERVE REPAIR Left 02/19/2016   Procedure: NERVE REPAIR;  Surgeon: Roseanne Kaufman, MD;  Location: [facility];  Service: Orthopedics;  Laterality: Left;  . NO PAST SURGERIES    . TENDON REPAIR Left 02/19/2016   Procedure: TENDON REPAIR;  Surgeon: Roseanne Kaufman, MD;  Location: [facility];  Service: Orthopedics;  Laterality: Left;    There were no vitals filed for this visit.      Subjective Assessment - 05/17/16 1705    Subjective  Pt reports MD cleared her to d/c splint, and begin putty   Pertinent History 02/19/16 flexor tendon repair   Limitations do NOT use scar extractor d/t pt's skin sensitivity, 04/25/16- Per Aaron Edelman pt should wear splint when out, no putty or gripping yet   Patient Stated Goals regain use of her dominant hand   Currently in Pain? No/denies             Treatment:  Hand Exercises  Other Hand Exercises   Continued A/ROM, place and hold, and P/ROM in flexion exercises for digits Lt hand outside splint, including PIP  blocking, and passive finger extension. Pt has only trace activation for  flexion at DIP for middle finger and she demonstrates limitations in PIP joint flexion and extension.      Strengthening  Yellow putty for grip and pinch, see pt instructions. Pt reports Avelina Laine PA-c cleared her to begin    Pt wore buddy strap to assist                    left palm - and 2, 3,  digits, base of long and index fingers  Ultrasound Parameters 3 Mhz, 0.8 wts/cm2, 50% x 8 minutes                  OT Education - 05/17/16 1705    Education provided Yes   Education Details yellow putty HEP   Person(s) Educated Patient   Methods Explanation;Demonstration;Verbal cues   Comprehension Verbalized understanding;Returned demonstration          OT Short Term Goals - 04/25/16 1523      OT SHORT TERM GOAL #1   Title I with splint wear, care and precautions following 1-2 weeks of wear to ensure proper fit.   Time 6   Period Weeks   Status Achieved     OT SHORT TERM GOAL #2   Title I with inital HEP.   Time 6   Period Weeks   Status Achieved     OT SHORT TERM GOAL #3   Title Pt will demonstrate thumb IP and MP flexion WFLS for ADLS.   Time 6   Period Weeks  Status Partially Met  MP 75, IP 65     OT SHORT TERM GOAL #4   Title Pt will verbalize understanding of scar massage   Time 6   Period Weeks   Status Achieved           OT Long Term Goals - 03/03/16 0754      OT LONG TERM GOAL #1   Title I with updated HEP.   Time 12   Period Weeks   Status New     OT LONG TERM GOAL #2   Title Pt will demonstrate LUE grip strength of at least 30 lbs for increased functional use   Time 12   Period Weeks   Status New     OT LONG TERM GOAL #3   Title Pt will resume use of LUE as dominant hand at least 90% of the time with pain less than or equal to 3/10.   Time 12   Period Weeks   Status New     OT LONG TERM GOAL #4   Title Pt will demonstrate tip and lateral pinch with LUE no less than 2 lbs of RUE measurement, for increased functional use.   Time 12   Period Weeks   Status New               Plan - 05/17/16 1706    Clinical Impression  Statement Pt is progressing slowly towards goals. She demonstrates understanding of putty HEP   Rehab Potential Good   OT Frequency 2x / week   OT Duration 12 weeks   OT Treatment/Interventions Self-care/ADL training;Moist Heat;Fluidtherapy;DME and/or AE instruction;Splinting;Patient/family education;Therapeutic exercises;Contrast Bath;Ultrasound;Therapeutic exercise;Scar mobilization;Therapeutic activities;Passive range of motion;Neuromuscular education;Cryotherapy;Electrical Stimulation;Parrafin;Energy conservation;Manual Therapy;Visual/perceptual remediation/compensation   Plan reinforce/ progress putty   Consulted and Agree with Plan of Care Patient      Patient will benefit from skilled therapeutic intervention in order to improve the following deficits and impairments:  Decreased coordination, Decreased range of motion, Impaired flexibility, Impaired sensation, Increased edema, Decreased safety awareness, Decreased endurance, Decreased activity tolerance, Decreased knowledge of precautions, Decreased skin integrity, Impaired UE functional use, Pain, Decreased scar mobility, Decreased strength  Visit Diagnosis: Stiffness of left hand, not elsewhere classified  Pain in left hand  Stiffness of left wrist, not elsewhere classified  Other disturbances of skin sensation  Muscle weakness (generalized)    Problem List Patient Active Problem List   Diagnosis Date Noted  . Flexor tendon laceration, hand, open wound 02/19/2016    RINE,KATHRYN 05/17/2016, 5:07 PM  [facility] [address] [place], [place], [address] Phone: [phone]   Fax:  [phone]  Name: Heidi Fritz MRN: [id number] Date of Birth: 01/02/1991

## 2016-05-19 ENCOUNTER — Encounter: Payer: Self-pay | Admitting: Occupational Therapy

## 2016-05-24 ENCOUNTER — Ambulatory Visit: Payer: Self-pay | Admitting: Occupational Therapy

## 2016-06-13 ENCOUNTER — Encounter: Payer: Self-pay | Admitting: Occupational Therapy

## 2016-06-13 NOTE — Therapy (Signed)
[facility] [address]  [place], [place], [address] Phone: [phone]   Fax:  [phone]  Patient Details  Name: Heidi Fritz MRN: [id number] Date of Birth: 1990-09-01 Referring Provider:  Dr. Amedeo Plenty Encounter Date: 06/13/2016   OCCUPATIONAL THERAPY DISCHARGE SUMMARY  Visits from Start of Care: 16  Current functional level related to goals / functional outcomes:     OT Short Term Goals - 04/25/16 1523      OT SHORT TERM GOAL #1   Title I with splint wear, care and precautions following 1-2 weeks of wear to ensure proper fit.   Time 6   Period Weeks   Status Achieved     OT SHORT TERM GOAL #2   Title I with inital HEP.   Time 6   Period Weeks   Status Achieved     OT SHORT TERM GOAL #3   Title Pt will demonstrate thumb IP and MP flexion WFLS for ADLS.   Time 6   Period Weeks   Status Partially Met  MP 75, IP 65     OT SHORT TERM GOAL #4   Title Pt will verbalize understanding of scar massage   Time 6   Period Weeks   Status Achieved     LTG's unknown secondary to pt did not return after last visit on 05/17/16.    Remaining deficits: strength   Education / Equipment: HEP's, splint wear and care, scar massage  Plan: Patient agrees to discharge.  Patient goals were partially met. Patient is being discharged due to not returning since the last visit.  ?????       Carey Bullocks, OTR/L 06/13/2016, 10:36 AM  [facility] [address]  [place], [place], [address] Phone: [phone]   Fax:  [phone]

## 2017-06-27 ENCOUNTER — Emergency Department (HOSPITAL_COMMUNITY)
Admission: EM | Admit: 2017-06-27 | Discharge: 2017-06-27 | Discharge status: Against Medical Advice | Payer: Self-pay | Attending: Emergency Medicine | Admitting: Emergency Medicine

## 2017-06-27 ENCOUNTER — Encounter (HOSPITAL_COMMUNITY): Payer: Self-pay

## 2017-06-27 DIAGNOSIS — Z5321 Procedure and treatment not carried out due to patient leaving prior to being seen by health care provider: Secondary | ICD-10-CM | POA: Insufficient documentation

## 2017-06-27 NOTE — ED Notes (Signed)
Patient called friend and stated she did not want to stay and be seen. Patient left ambulatory with friend.

## 2017-06-27 NOTE — ED Notes (Signed)
Pt report during triage t ha she did not want to be seen by provider and that she had no complaints.
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
## 2018-03-29 ENCOUNTER — Encounter: Payer: Self-pay | Admitting: General Practice

## 2018-03-29 ENCOUNTER — Ambulatory Visit: Payer: Self-pay | Admitting: *Deleted

## 2018-03-29 ENCOUNTER — Other Ambulatory Visit: Payer: Self-pay

## 2018-03-29 VITALS — BP 137/88 | HR 88 | Temp 98.2°F | Ht 62.0 in | Wt 108.2 lb

## 2018-03-29 DIAGNOSIS — Z3201 Encounter for pregnancy test, result positive: Secondary | ICD-10-CM

## 2018-03-29 DIAGNOSIS — Z3200 Encounter for pregnancy test, result unknown: Secondary | ICD-10-CM

## 2018-03-29 LAB — POCT URINE PREGNANCY: PREG TEST UR: POSITIVE — AB

## 2018-03-29 NOTE — Progress Notes (Signed)
     Heidi Fritz presents today for UPT. She has no unusual complaints. LMP: 02/24/18  EDD: 12/01/2018  G1P0   GA: [redacted]w[redacted]d.    OBJECTIVE: Appears well, in no apparent distress.  OB History    Gravida  1   Para      Term      Preterm      AB      Living        SAB      TAB      Ectopic      Multiple      Live Births             Home UPT Result:  Positive x 5 tests. In-Office UPT result:  Positive I have reviewed the patient's medical, obstetrical, social, and family histories, and medications.   ASSESSMENT: Positive pregnancy test  PLAN Prenatal care to be completed at: [facility] Continue Prenatal Vitamins. Return for nurse visit for prenatal labs and history.  Clovis Pu, RN

## 2018-04-09 ENCOUNTER — Telehealth: Payer: Self-pay

## 2018-04-09 ENCOUNTER — Other Ambulatory Visit: Payer: Self-pay | Admitting: Obstetrics and Gynecology

## 2018-04-09 ENCOUNTER — Other Ambulatory Visit: Payer: Self-pay

## 2018-04-09 DIAGNOSIS — O219 Vomiting of pregnancy, unspecified: Secondary | ICD-10-CM

## 2018-04-09 MED ORDER — PROMETHAZINE HCL 25 MG PO TABS: 25.0000 mg | ORAL_TABLET | Freq: Four times a day (QID) | ORAL | 1 refills | Status: DC | PRN

## 2018-04-09 MED ORDER — DOXYLAMINE-PYRIDOXINE 10-10 MG PO TBEC: 2.0000 | DELAYED_RELEASE_TABLET | Freq: Every day | ORAL | 5 refills | Status: DC

## 2018-04-09 NOTE — Progress Notes (Signed)
Rx resent and [facility] [place]

## 2018-04-09 NOTE — Telephone Encounter (Signed)
Return call to [facility]  pt regarding  appt is Oct 10th 2019  Pt unable to keep [employer] down\I went over measures per [facility] protocol booklet Pt is doing all. Pt requesting Rx to help with Nausea Pt made aware I would sent request.  May be 1-2 days for response Pt advised is Sx's worsen to go to MAU.  Pt  Voiced understanding.

## 2018-04-10 ENCOUNTER — Telehealth: Payer: Self-pay

## 2018-04-10 NOTE — Telephone Encounter (Signed)
Zofran not used in first trimester due to concerns about cleft lip/palate.  She can use Phenergan as prescribed. If another medication is needed, Reglan can be prescribed.  Jaynie CollinsUGONNA  Yael Angerer, MD, FACOG Obstetrician & Gynecologist, [facility], [facility]

## 2018-04-10 NOTE — Telephone Encounter (Signed)
Pt is self pay cannot pay $437 for diclegics Pt is going to get Phenergan that you prescribed , however pt mentioned zofran  [facility] [address] [facility] [place].

## 2018-04-11 NOTE — Telephone Encounter (Signed)
Pt notified. Pt voiced understanding  

## 2018-05-03 ENCOUNTER — Encounter: Payer: Self-pay | Admitting: Obstetrics and Gynecology

## 2018-05-03 ENCOUNTER — Ambulatory Visit (INDEPENDENT_AMBULATORY_CARE_PROVIDER_SITE_OTHER): Payer: Self-pay | Admitting: Obstetrics and Gynecology

## 2018-05-03 VITALS — BP 111/77 | HR 96 | Wt 111.0 lb

## 2018-05-03 DIAGNOSIS — Z124 Encounter for screening for malignant neoplasm of cervix: Secondary | ICD-10-CM

## 2018-05-03 DIAGNOSIS — Z3401 Encounter for supervision of normal first pregnancy, first trimester: Secondary | ICD-10-CM

## 2018-05-03 DIAGNOSIS — N898 Other specified noninflammatory disorders of vagina: Secondary | ICD-10-CM

## 2018-05-03 DIAGNOSIS — Z113 Encounter for screening for infections with a predominantly sexual mode of transmission: Secondary | ICD-10-CM

## 2018-05-03 DIAGNOSIS — Z3A09 9 weeks gestation of pregnancy: Secondary | ICD-10-CM

## 2018-05-03 DIAGNOSIS — Z3490 Encounter for supervision of normal pregnancy, unspecified, unspecified trimester: Secondary | ICD-10-CM | POA: Insufficient documentation

## 2018-05-03 NOTE — Progress Notes (Signed)
Pt went to [facility] on 1005/2019  Was told she was 10 weeks.  Pt wants Genetic Screening. Declines Flu.

## 2018-05-03 NOTE — Progress Notes (Signed)
INITIAL PRENATAL VISIT NOTE  Subjective:  Heidi Fritz is a 27 y.o. G1P0 at [redacted]w[redacted]d by LMP being seen today for her initial prenatal visit. This is an unplanned pregnancy. She and partner are happy with the pregnancy. She was using nothing for birth control previously. She has an obstetric history significant for n/a. She has a medical history significant for n/a.  Patient reports some nausea early on but now feeling better.  Contractions: Not present. Vag. Bleeding: None.  Movement: Absent. Denies leaking of fluid.   Past Medical History:  Diagnosis Date  . Anxiety    Past Surgical History:  Procedure Laterality Date  . I&D EXTREMITY Left 02/19/2016   Procedure: IRRIGATION AND DEBRIDEMENT  AND REPAIR AS NECESSARY ARM , WRIST;  Surgeon: Dominica Severin, MD;  [facility];  Service: Orthopedics;  Laterality: Left;  . NERVE REPAIR Left 02/19/2016   Procedure: NERVE REPAIR;  Surgeon: Dominica Severin, MD;  Location: [facility] OR;  Service: Orthopedics;  Laterality: Left;  . NO PAST SURGERIES    . TENDON REPAIR Left 02/19/2016   Procedure: TENDON REPAIR;  Surgeon: Dominica Severin, MD;  Location: [facility] OR;  Service: Orthopedics;  Laterality: Left;    OB History  Gravida Para Term Preterm AB Living  1            SAB TAB Ectopic Multiple Live Births               # Outcome Date GA Lbr Len/2nd Weight Sex Delivery Anes PTL Lv  1 Current             Social History   Socioeconomic History  . Marital status: Single    Spouse name: Not on file  . Number of children: Not on file  . Years of education: Not on file  . Highest education level: Not on file  Occupational History  . Not on file  Social Needs  . Financial resource strain: Not on file  . Food insecurity:    Worry: Not on file    Inability: Not on file  . Transportation needs:    Medical: Not on file    Non-medical: Not on file  Tobacco Use  . Smoking status: Never Smoker  . Smokeless tobacco: Never Used  Substance and Sexual  Activity  . Alcohol use: Not Currently    Comment: Stopped one month ago  . Drug use: Not Currently    Frequency: 7.0 times per week    Types: Marijuana    Comment: Stopped 03/22/18  . Sexual activity: Yes    Birth control/protection: None  Lifestyle  . Physical activity:    Days per week: Not on file    Minutes per session: Not on file  . Stress: Not on file  Relationships  . Social connections:    Talks on phone: Not on file    Gets together: Not on file    Attends religious service: Not on file    Active member of club or organization: Not on file    Attends meetings of clubs or organizations: Not on file    Relationship status: Not on file  Other Topics Concern  . Not on file  Social History Narrative  . Not on file   History reviewed. No pertinent family history.   Current Outpatient Medications:  .  Prenatal Multivit-Min-Fe-FA (PRENATAL VITAMINS PO), Take by mouth., Disp: , Rfl:   No Known Allergies  Review of Systems: Negative except for what is mentioned  in HPI.  Objective:   Vitals:   05/03/18 1004  BP: 111/77  Pulse: 96  Weight: 111 lb (50.3 kg)   Fetal Status:     Movement: Absent   FHR 168 bpm  Physical Exam: BP 111/77   Pulse 96   Wt 111 lb (50.3 kg)   LMP 02/24/2018 (Approximate)   BMI 20.30 kg/m  CONSTITUTIONAL: Well-developed, well-nourished female in no acute distress.  NEUROLOGIC: Alert and oriented to person, place, and time. Normal reflexes, muscle tone coordination. No cranial nerve deficit noted. PSYCHIATRIC: Normal mood and affect. Normal behavior. Normal judgment and thought content. SKIN: Skin is warm and dry. No rash noted. Not diaphoretic. No erythema. No pallor. HENT:  Normocephalic, atraumatic, External right and left ear normal. Oropharynx is clear and moist EYES: Conjunctivae and EOM are normal. Pupils are equal, round, and reactive to light. No scleral icterus.  NECK: Normal range of motion, supple, no masses CARDIOVASCULAR:  Normal heart rate noted, regular rhythm RESPIRATORY: Effort and breath sounds normal, no problems with respiration noted BREASTS: symmetric, non-tender, no masses palpable ABDOMEN: Soft, nontender, nondistended, gravid. GU: normal appearing external female genitalia, nulliparous normal appearing cervix, scant white discharge in vagina, no lesions noted Bimanual: 10 weeks sized uterus, no adnexal tenderness or palpable lesions noted MUSCULOSKELETAL: Normal range of motion. EXT:  No edema and no tenderness. 2+ distal pulses.  Assessment and Plan:  Pregnancy: G1P0 at [redacted]w[redacted]d by LMP  1. Encounter for supervision of normal pregnancy, antepartum, unspecified gravidity - Obstetric Panel, Including HIV - Culture, OB Urine - Cytology - PAP - Genetic Screening - Hemoglobinopathy evaluation - Cystic Fibrosis Mutation 97 - SMN1 COPY NUMBER ANALYSIS (SMA Carrier Screen) - Enroll Patient in Babyscripts - [place] MFM OB COMP + 14 WK; Future - Cervicovaginal ancillary only   Preterm labor symptoms and general obstetric precautions including but not limited to vaginal bleeding, contractions, leaking of fluid and fetal movement were reviewed in detail with the patient.  Please refer to After Visit Summary for other counseling recommendations.   Return in about 4 weeks (around 05/31/2018) for OB visit.  Conan Bowens 05/03/2018 1:45 PM

## 2018-05-04 LAB — OBSTETRIC PANEL, INCLUDING HIV
Antibody Screen: NEGATIVE
BASOS ABS: 0.1 10*3/uL (ref 0.0–0.2)
Basos: 1 %
EOS (ABSOLUTE): 0.1 10*3/uL (ref 0.0–0.4)
Eos: 1 %
HEP B S AG: NEGATIVE
HIV SCREEN 4TH GENERATION: NONREACTIVE
Hematocrit: 41.6 % (ref 34.0–46.6)
Hemoglobin: 14 g/dL (ref 11.1–15.9)
IMMATURE GRANS (ABS): 0 10*3/uL (ref 0.0–0.1)
IMMATURE GRANULOCYTES: 0 %
LYMPHS: 21 %
Lymphocytes Absolute: 1.7 10*3/uL (ref 0.7–3.1)
MCH: 29.7 pg (ref 26.6–33.0)
MCHC: 33.7 g/dL (ref 31.5–35.7)
MCV: 88 fL (ref 79–97)
MONOCYTES: 9 %
Monocytes Absolute: 0.7 10*3/uL (ref 0.1–0.9)
NEUTROS ABS: 5.6 10*3/uL (ref 1.4–7.0)
Neutrophils: 68 %
Platelets: 347 10*3/uL (ref 150–450)
RBC: 4.72 x10E6/uL (ref 3.77–5.28)
RDW: 12.6 % (ref 12.3–15.4)
RPR: NONREACTIVE
RUBELLA: 1.46 {index} (ref >0.99)
Rh Factor: POSITIVE
WBC: 8.2 10*3/uL (ref 3.4–10.8)

## 2018-05-04 LAB — CYTOLOGY - PAP: Diagnosis: NEGATIVE

## 2018-05-04 LAB — CERVICOVAGINAL ANCILLARY ONLY
BACTERIAL VAGINITIS: NEGATIVE
CANDIDA VAGINITIS: NEGATIVE
CHLAMYDIA, DNA PROBE: NEGATIVE
Neisseria Gonorrhea: NEGATIVE
TRICH (WINDOWPATH): NEGATIVE

## 2018-05-05 LAB — URINE CULTURE, OB REFLEX

## 2018-05-05 LAB — CULTURE, OB URINE

## 2018-05-10 IMAGING — CR DG HAND 2V*L*
2 series · 2 of 2 positions shown · non-contrast
Comparison: None.

CLINICAL DATA: The patient's left hand went through a storm glass
this morning result in lacerations. Pain. Initial encounter.

EXAM:
LEFT HAND - 2 VIEW

[hand pa]
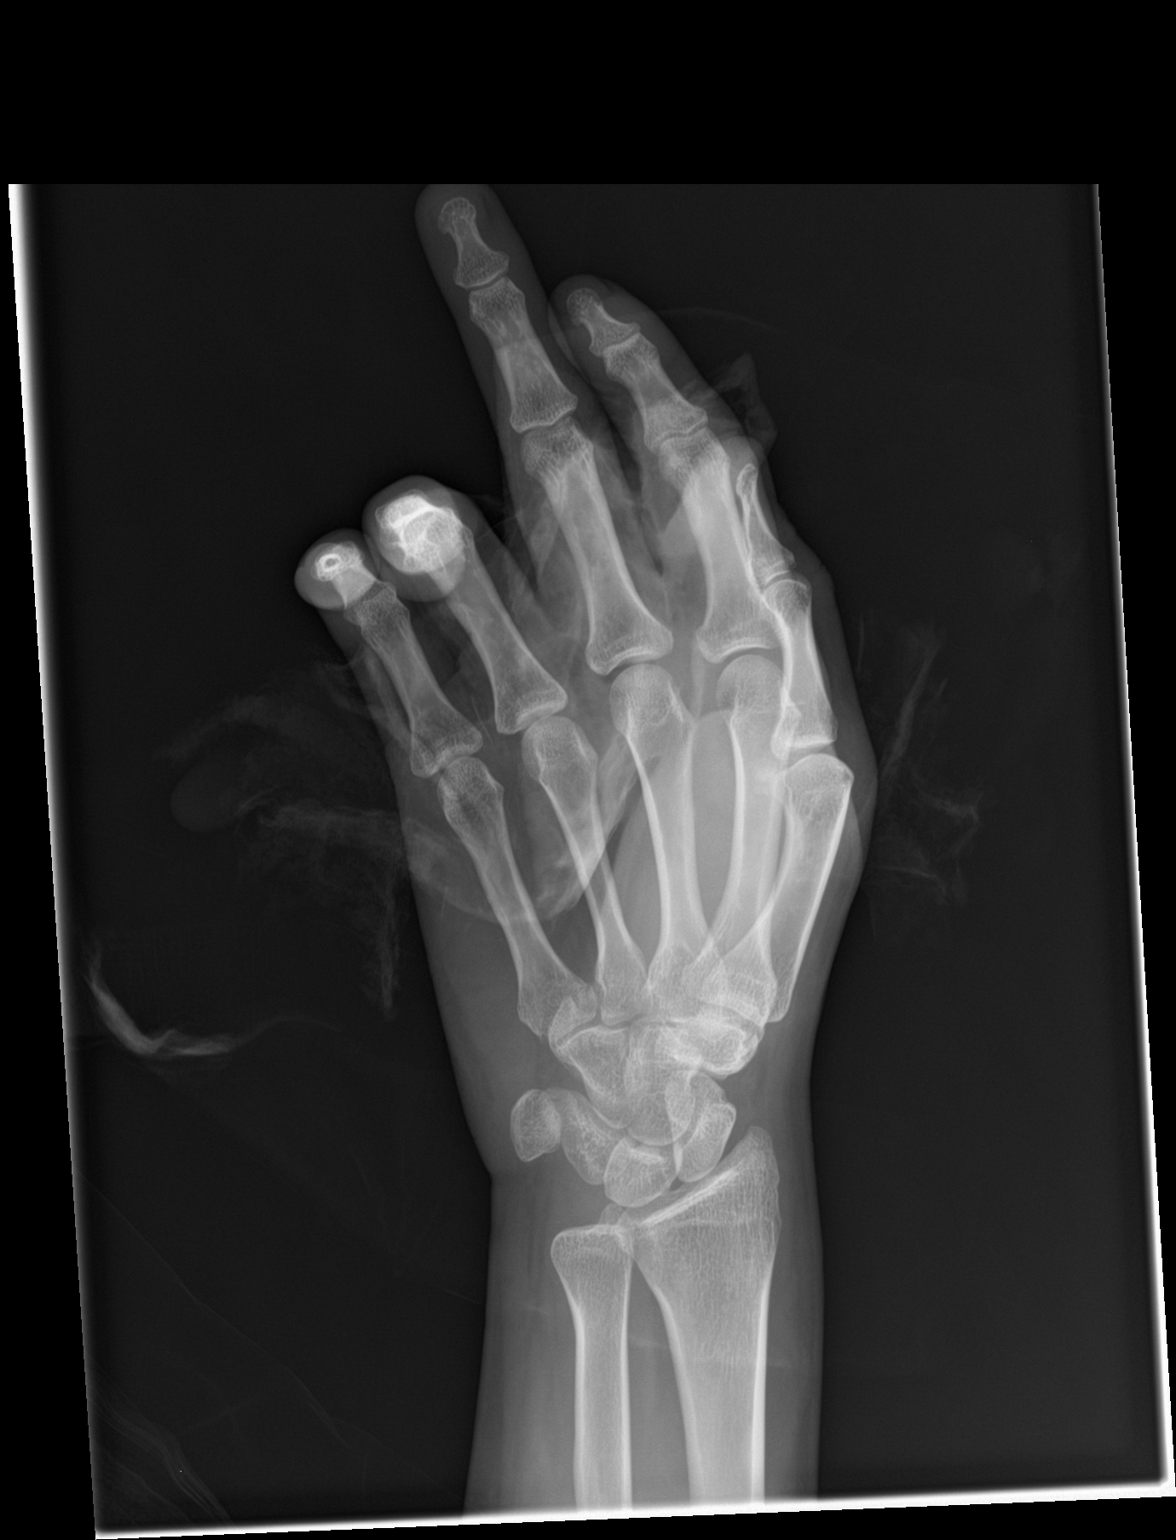

[hand lat]
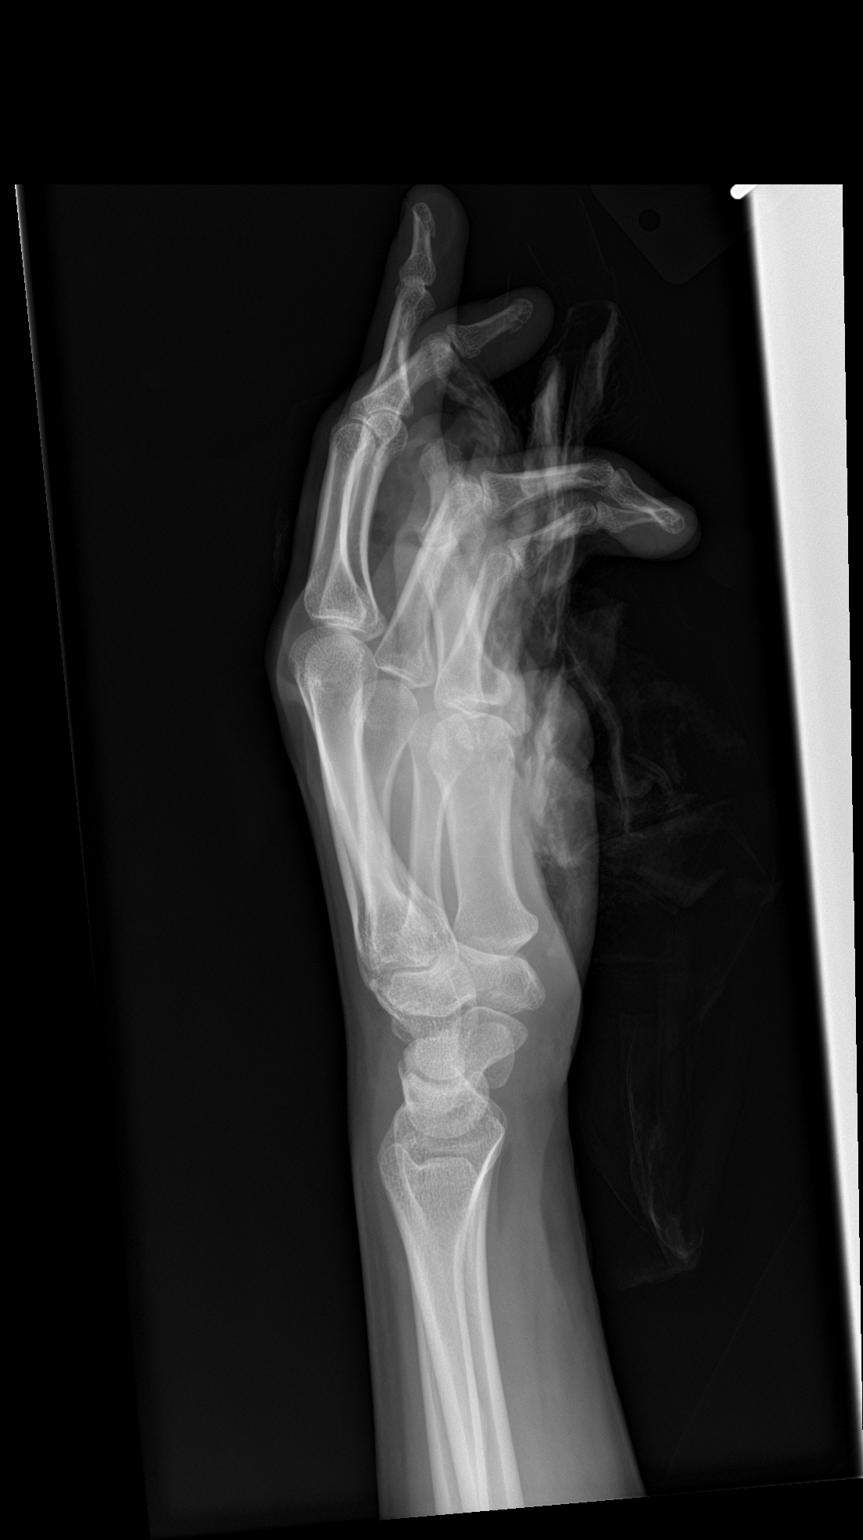

[2 of 2 positions shown; findings below may reference images not displayed]

FINDINGS: The ring and little fingers are bandaged and flexed limiting the
study. No acute bony or joint abnormality is seen. No radiopaque
foreign body is identified.
IMPRESSION: Limited study.  Negative for fracture foreign body.

## 2018-05-11 ENCOUNTER — Encounter: Payer: Self-pay | Admitting: Obstetrics and Gynecology

## 2018-05-14 ENCOUNTER — Telehealth: Payer: Self-pay

## 2018-05-14 NOTE — Telephone Encounter (Signed)
Returned call and per patient request, left detailed message on vm with panorama results.

## 2018-05-24 ENCOUNTER — Encounter: Payer: Self-pay | Admitting: Obstetrics and Gynecology

## 2018-05-31 ENCOUNTER — Ambulatory Visit (INDEPENDENT_AMBULATORY_CARE_PROVIDER_SITE_OTHER): Payer: Self-pay | Admitting: Obstetrics and Gynecology

## 2018-05-31 ENCOUNTER — Encounter: Payer: Self-pay | Admitting: Obstetrics and Gynecology

## 2018-05-31 DIAGNOSIS — Z3401 Encounter for supervision of normal first pregnancy, first trimester: Secondary | ICD-10-CM

## 2018-05-31 DIAGNOSIS — Z23 Encounter for immunization: Secondary | ICD-10-CM

## 2018-05-31 DIAGNOSIS — Z3490 Encounter for supervision of normal pregnancy, unspecified, unspecified trimester: Secondary | ICD-10-CM

## 2018-05-31 DIAGNOSIS — Z3A13 13 weeks gestation of pregnancy: Secondary | ICD-10-CM

## 2018-05-31 NOTE — Addendum Note (Signed)
Addended by: Judd Gaudier on: 05/31/2018 10:23 AM   Modules accepted: Orders

## 2018-05-31 NOTE — Progress Notes (Signed)
   PRENATAL VISIT NOTE  Subjective:  Heidi Fritz is a 27 y.o. G1P0 at [redacted]w[redacted]d being seen today for ongoing prenatal care.  She is currently monitored for the following issues for this low-risk pregnancy and has Flexor tendon laceration, hand, open wound and Supervision of normal pregnancy on their problem list.  Patient reports no complaints.  Contractions: Not present. Vag. Bleeding: None.  Movement: Absent. Denies leaking of fluid.   The following portions of the patient's history were reviewed and updated as appropriate: allergies, current medications, past family history, past medical history, past social history, past surgical history and problem list. Problem list updated.  Objective:   Vitals:   05/31/18 1001  BP: 114/67  Pulse: 96  Weight: 118 lb 6.4 oz (53.7 kg)    Fetal Status: Fetal Heart Rate (bpm): 158(Simultaneous filing. User may not have seen previous data.)   Movement: Absent     General:  Alert, oriented and cooperative. Patient is in no acute distress.  Skin: Skin is warm and dry. No rash noted.   Cardiovascular: Normal heart rate noted  Respiratory: Normal respiratory effort, no problems with respiration noted  Abdomen: Soft, gravid, appropriate for gestational age.  Pain/Pressure: Absent     Pelvic: Cervical exam deferred        Extremities: Normal range of motion.  Edema: None  Mental Status: Normal mood and affect. Normal behavior. Normal judgment and thought content.   Assessment and Plan:  Pregnancy: G1P0 at [redacted]w[redacted]d  1. Encounter for supervision of normal pregnancy, antepartum, unspecified gravidity Patient is doing well without complaints Flu vaccine today Anatomy ultrasound to be scheduled  Preterm labor symptoms and general obstetric precautions including but not limited to vaginal bleeding, contractions, leaking of fluid and fetal movement were reviewed in detail with the patient. Please refer to After Visit Summary for other counseling  recommendations.  Return in about 4 weeks (around 06/28/2018) for ROB.  No future appointments.  Catalina Antigua, MD

## 2018-06-25 ENCOUNTER — Inpatient Hospital Stay (HOSPITAL_COMMUNITY)
Admission: AD | Admit: 2018-06-25 | Discharge: 2018-06-25 | Disposition: A | Discharge status: Home / Self Care | Payer: 59 | Source: Ambulatory Visit | Attending: Obstetrics and Gynecology | Admitting: Obstetrics and Gynecology

## 2018-06-25 ENCOUNTER — Encounter (HOSPITAL_COMMUNITY): Payer: Self-pay

## 2018-06-25 DIAGNOSIS — Z3A17 17 weeks gestation of pregnancy: Secondary | ICD-10-CM | POA: Insufficient documentation

## 2018-06-25 DIAGNOSIS — R109 Unspecified abdominal pain: Secondary | ICD-10-CM | POA: Insufficient documentation

## 2018-06-25 DIAGNOSIS — O26892 Other specified pregnancy related conditions, second trimester: Secondary | ICD-10-CM | POA: Diagnosis not present

## 2018-06-25 DIAGNOSIS — O26899 Other specified pregnancy related conditions, unspecified trimester: Secondary | ICD-10-CM

## 2018-06-25 DIAGNOSIS — R102 Pelvic and perineal pain: Secondary | ICD-10-CM

## 2018-06-25 LAB — URINALYSIS, ROUTINE W REFLEX MICROSCOPIC
Bilirubin Urine: NEGATIVE
Glucose, UA: NEGATIVE mg/dL
Hgb urine dipstick: NEGATIVE
Ketones, ur: NEGATIVE mg/dL
Leukocytes, UA: NEGATIVE
Nitrite: NEGATIVE
Protein, ur: NEGATIVE mg/dL
Specific Gravity, Urine: 1.017 (ref 1.005–1.030)
pH: 5 (ref 5.0–8.0)

## 2018-06-25 NOTE — MAU Provider Note (Signed)
Chief Complaint: Abdominal Pain and Emesis   First Provider Initiated Contact with Patient 06/25/18 0803     SUBJECTIVE HPI: Heidi Fritz is a 27 y.o. G1P0 at [redacted]w[redacted]d who presents to Maternity Admissions reporting abdominal pain and vomiting. Symptoms occurred this morning.  Reports having sharp pain in her RLQ this morning when she went to lay on her right side. Pain resolved without intervention. Also vomited once this morning. Saw streak of blood in vomit. Denies continued nausea or vomiting.  Currently denies abdominal pain.  Denies fever/chills, flank pain, dysuria, vaginal discharge, LOF, or vaginal bleeding.   Past Medical History:  Diagnosis Date  . Anxiety    OB History  Gravida Para Term Preterm AB Living  1            SAB TAB Ectopic Multiple Live Births               # Outcome Date GA Lbr Len/2nd Weight Sex Delivery Anes PTL Lv  1 Current            Past Surgical History:  Procedure Laterality Date  . I&D EXTREMITY Left 02/19/2016   Procedure: IRRIGATION AND DEBRIDEMENT  AND REPAIR AS NECESSARY ARM , WRIST;  Surgeon: Dominica Severin, MD;  Location: [facility] OR;  Service: Orthopedics;  Laterality: Left;  . NERVE REPAIR Left 02/19/2016   Procedure: NERVE REPAIR;  Surgeon: Dominica Severin, MD;  Location: [facility] OR;  Service: Orthopedics;  Laterality: Left;  . TENDON REPAIR Left 02/19/2016   Procedure: TENDON REPAIR;  Surgeon: Dominica Severin, MD;  Location: [facility] OR;  Service: Orthopedics;  Laterality: Left;   Social History   Socioeconomic History  . Marital status: Single    Spouse name: Not on file  . Number of children: Not on file  . Years of education: Not on file  . Highest education level: Not on file  Occupational History  . Not on file  Social Needs  . Financial resource strain: Not on file  . Food insecurity:    Worry: Not on file    Inability: Not on file  . Transportation needs:    Medical: Not on file    Non-medical: Not on file  Tobacco Use  . Smoking status:  Never Smoker  . Smokeless tobacco: Never Used  Substance and Sexual Activity  . Alcohol use: Not Currently    Comment: Stopped one month ago  . Drug use: Not Currently    Frequency: 7.0 times per week    Types: Marijuana    Comment: Stopped 03/22/18  . Sexual activity: Yes    Birth control/protection: None  Lifestyle  . Physical activity:    Days per week: Not on file    Minutes per session: Not on file  . Stress: Not on file  Relationships  . Social connections:    Talks on phone: Not on file    Gets together: Not on file    Attends religious service: Not on file    Active member of club or organization: Not on file    Attends meetings of clubs or organizations: Not on file    Relationship status: Not on file  . Intimate partner violence:    Fear of current or ex partner: Not on file    Emotionally abused: Not on file    Physically abused: Not on file    Forced sexual activity: Not on file  Other Topics Concern  . Not on file  Social History Narrative  .  Not on file   History reviewed. No pertinent family history. No current facility-administered medications on file prior to encounter.    Current Outpatient Medications on File Prior to Encounter  Medication Sig Dispense Refill  . Prenatal Multivit-Min-Fe-FA (PRENATAL VITAMINS PO) Take by mouth.     No Known Allergies  I have reviewed patient's Past Medical Hx, Surgical Hx, Family Hx, Social Hx, medications and allergies.   Review of Systems  Constitutional: Negative.   Gastrointestinal: Positive for abdominal pain (none currently) and vomiting (once this morning). Negative for diarrhea and nausea.  Genitourinary: Negative.     OBJECTIVE Patient Vitals for the past 24 hrs:  BP Temp Temp src Pulse Resp Weight  06/25/18 0742 116/73 98 F (36.7 C) Oral 100 18 -  06/25/18 0723 - - - - - 55.5 kg   Constitutional: Well-developed, well-nourished female in no acute distress.  Cardiovascular: normal rate & rhythm, no  murmur Respiratory: normal rate and effort. Lung sounds clear throughout GI: Abd soft, non-tender, Pos BS x 4. No guarding or rebound tenderness MS: Extremities nontender, no edema, normal ROM Neurologic: Alert and oriented x 4.  GU: cervix closed/thick  LAB RESULTS Results for orders placed or performed during the hospital encounter of 06/25/18 (from the past 24 hour(s))  Urinalysis, Routine w reflex microscopic     Status: Abnormal   Collection Time: 06/25/18  7:39 AM  Result Value Ref Range   Color, Urine YELLOW YELLOW   APPearance CLOUDY (A) CLEAR   Specific Gravity, Urine 1.017 1.005 - 1.030   pH 5.0 5.0 - 8.0   Glucose, UA NEGATIVE NEGATIVE mg/dL   Hgb urine dipstick NEGATIVE NEGATIVE   Bilirubin Urine NEGATIVE NEGATIVE   Ketones, ur NEGATIVE NEGATIVE mg/dL   Protein, ur NEGATIVE NEGATIVE mg/dL   Nitrite NEGATIVE NEGATIVE   Leukocytes, UA NEGATIVE NEGATIVE    IMAGING No results found.  MAU COURSE Orders Placed This Encounter  Procedures  . Urinalysis, Routine w reflex microscopic  . Discharge patient   No orders of the defined types were placed in this encounter.   MDM FHT present via doppler Cervix closed/thick Abdomen soft & non tender Pt asymptomatic at this time  ASSESSMENT 1. Pain of round ligament during pregnancy   2. [redacted] weeks gestation of pregnancy     PLAN Discharge home in stable condition. SAB precautions  Allergies as of 06/25/2018   No Known Allergies     Medication List    TAKE these medications   PRENATAL VITAMINS PO Take by mouth.        Judeth HornLawrence, Fendi Meinhardt, NP 06/25/2018  3:05 PM

## 2018-06-25 NOTE — Discharge Instructions (Signed)

## 2018-06-25 NOTE — MAU Note (Signed)
Today she woke up around 0530 with sharp pain in her right mid abdomen, was relieved with position changes but anytime she turns on her right side the pain comes back  Vomited x1 this AM and saw blood in her vomit, no nausea currently  No vaginal bleeding

## 2018-06-28 ENCOUNTER — Encounter: Payer: Self-pay | Admitting: Obstetrics and Gynecology

## 2018-06-28 ENCOUNTER — Ambulatory Visit (INDEPENDENT_AMBULATORY_CARE_PROVIDER_SITE_OTHER): Payer: 59 | Admitting: Obstetrics and Gynecology

## 2018-06-28 VITALS — BP 132/72 | HR 105 | Wt 123.0 lb

## 2018-06-28 DIAGNOSIS — Z3402 Encounter for supervision of normal first pregnancy, second trimester: Secondary | ICD-10-CM

## 2018-06-28 DIAGNOSIS — Z3A17 17 weeks gestation of pregnancy: Secondary | ICD-10-CM

## 2018-06-28 NOTE — Progress Notes (Signed)
Pt is here for ROB. G1P0 3690w5d.

## 2018-06-28 NOTE — Progress Notes (Signed)
ROB. PN labs done today, patient now has [employer]

## 2018-06-28 NOTE — Progress Notes (Signed)
   PRENATAL VISIT NOTE  Subjective:  Heidi Fritz is a 27 y.o. G1P0 at 9541w5d being seen today for ongoing prenatal care.  She is currently monitored for the following issues for this low-risk pregnancy and has Supervision of normal pregnancy on their problem list.  Patient reports no complaints.  Contractions: Not present. Vag. Bleeding: None.  Movement: Absent. Denies leaking of fluid.   The following portions of the patient's history were reviewed and updated as appropriate: allergies, current medications, past family history, past medical history, past social history, past surgical history and problem list. Problem list updated.  Objective:   Vitals:   06/28/18 0957  BP: 132/72  Pulse: (!) 105  Weight: 123 lb (55.8 kg)    Fetal Status: Fetal Heart Rate (bpm): 156   Movement: Absent     General:  Alert, oriented and cooperative. Patient is in no acute distress.  Skin: Skin is warm and dry. No rash noted.   Cardiovascular: Normal heart rate noted  Respiratory: Normal respiratory effort, no problems with respiration noted  Abdomen: Soft, gravid, appropriate for gestational age.  Pain/Pressure: Absent     Pelvic: Cervical exam deferred        Extremities: Normal range of motion.  Edema: None  Mental Status: Normal mood and affect. Normal behavior. Normal judgment and thought content.   Assessment and Plan:  Pregnancy: G1P0 at 2041w5d  1. Encounter for supervision of normal first pregnancy in second trimester Patient is doing well Carrier testing today  AFP today Anatomy ultrasound scheduled 12/13  Preterm labor symptoms and general obstetric precautions including but not limited to vaginal bleeding, contractions, leaking of fluid and fetal movement were reviewed in detail with the patient. Please refer to After Visit Summary for other counseling recommendations.  Return in about 4 weeks (around 07/26/2018) for ROB.  Future Appointments  Date Time Provider Department Center    07/06/2018 10:30 AM [facility] US 1 [facility]    Catalina AntiguaPeggy Shyne Lehrke, MD

## 2018-06-29 ENCOUNTER — Encounter (HOSPITAL_COMMUNITY): Payer: Self-pay

## 2018-07-03 LAB — HEMOGLOBINOPATHY EVALUATION
HEMOGLOBIN A2 QUANTITATION: 2.4 % (ref 1.8–3.2)
HEMOGLOBIN F QUANTITATION: 0 % (ref 0.0–2.0)
HGB C: 0 %
HGB S: 0 %
HGB VARIANT: 0 %
Hgb A: 97.6 % (ref 96.4–98.8)

## 2018-07-06 ENCOUNTER — Other Ambulatory Visit (HOSPITAL_COMMUNITY): Payer: Self-pay | Admitting: *Deleted

## 2018-07-06 ENCOUNTER — Other Ambulatory Visit: Payer: Self-pay | Admitting: Obstetrics and Gynecology

## 2018-07-06 ENCOUNTER — Ambulatory Visit (HOSPITAL_COMMUNITY)
Admission: RE | Admit: 2018-07-06 | Discharge: 2018-07-06 | Disposition: A | Discharge status: Home / Self Care | Payer: 59 | Source: Ambulatory Visit | Attending: Obstetrics and Gynecology | Admitting: Obstetrics and Gynecology

## 2018-07-06 DIAGNOSIS — Z363 Encounter for antenatal screening for malformations: Secondary | ICD-10-CM | POA: Diagnosis not present

## 2018-07-06 DIAGNOSIS — Z3A18 18 weeks gestation of pregnancy: Secondary | ICD-10-CM | POA: Diagnosis not present

## 2018-07-06 DIAGNOSIS — O359XX0 Maternal care for (suspected) fetal abnormality and damage, unspecified, not applicable or unspecified: Secondary | ICD-10-CM

## 2018-07-06 DIAGNOSIS — O4442 Low lying placenta NOS or without hemorrhage, second trimester: Secondary | ICD-10-CM | POA: Diagnosis not present

## 2018-07-06 DIAGNOSIS — Z3490 Encounter for supervision of normal pregnancy, unspecified, unspecified trimester: Secondary | ICD-10-CM | POA: Diagnosis present

## 2018-07-06 DIAGNOSIS — O4402 Placenta previa specified as without hemorrhage, second trimester: Secondary | ICD-10-CM | POA: Diagnosis not present

## 2018-07-06 DIAGNOSIS — Z362 Encounter for other antenatal screening follow-up: Secondary | ICD-10-CM

## 2018-07-09 ENCOUNTER — Other Ambulatory Visit: Payer: Self-pay | Admitting: Obstetrics and Gynecology

## 2018-07-09 DIAGNOSIS — Z3490 Encounter for supervision of normal pregnancy, unspecified, unspecified trimester: Secondary | ICD-10-CM

## 2018-07-11 LAB — AFP, SERUM, OPEN SPINA BIFIDA
AFP MOM: 1.5
AFP Value: 69.2 ng/mL
GEST. AGE ON COLLECTION DATE: 17.5 wk
Maternal Age At EDD: 26.5 yr
OSBR Risk 1 IN: 2734
TEST RESULTS AFP: NEGATIVE
Weight: 123 [lb_av]

## 2018-07-11 LAB — SMN1 COPY NUMBER ANALYSIS (SMA CARRIER SCREENING)

## 2018-07-11 LAB — CYSTIC FIBROSIS MUTATION 97

## 2018-07-12 ENCOUNTER — Encounter: Payer: Self-pay | Admitting: Obstetrics and Gynecology

## 2018-07-12 DIAGNOSIS — O4400 Placenta previa specified as without hemorrhage, unspecified trimester: Secondary | ICD-10-CM | POA: Insufficient documentation

## 2018-07-26 ENCOUNTER — Encounter: Payer: Self-pay | Admitting: Obstetrics and Gynecology

## 2018-07-26 ENCOUNTER — Ambulatory Visit (INDEPENDENT_AMBULATORY_CARE_PROVIDER_SITE_OTHER): Payer: 59 | Admitting: Obstetrics and Gynecology

## 2018-07-26 VITALS — BP 118/73 | HR 90 | Wt 133.0 lb

## 2018-07-26 DIAGNOSIS — Z3402 Encounter for supervision of normal first pregnancy, second trimester: Secondary | ICD-10-CM

## 2018-07-26 DIAGNOSIS — O4402 Placenta previa specified as without hemorrhage, second trimester: Secondary | ICD-10-CM

## 2018-07-26 NOTE — Progress Notes (Signed)
   PRENATAL VISIT NOTE  Subjective:  Heidi Fritz is a 28 y.o. G1P0 at [redacted]w[redacted]d being seen today for ongoing prenatal care.  She is currently monitored for the following issues for this high-risk pregnancy and has Supervision of normal pregnancy and Placenta previa on their problem list.  Patient reports no complaints.  Contractions: Not present. Vag. Bleeding: None.  Movement: Present. Denies leaking of fluid.   The following portions of the patient's history were reviewed and updated as appropriate: allergies, current medications, past family history, past medical history, past social history, past surgical history and problem list. Problem list updated.  Objective:   Vitals:   07/26/18 1310  BP: 118/73  Pulse: 90  Weight: 133 lb (60.3 kg)    Fetal Status: Fetal Heart Rate (bpm): 154   Movement: Present     General:  Alert, oriented and cooperative. Patient is in no acute distress.  Skin: Skin is warm and dry. No rash noted.   Cardiovascular: Normal heart rate noted  Respiratory: Normal respiratory effort, no problems with respiration noted  Abdomen: Soft, gravid, appropriate for gestational age.  Pain/Pressure: Absent     Pelvic: Cervical exam deferred        Extremities: Normal range of motion.  Edema: None  Mental Status: Normal mood and affect. Normal behavior. Normal judgment and thought content.   Assessment and Plan:  Pregnancy: G1P0 at [redacted]w[redacted]d  1. Encounter for supervision of normal first pregnancy in second trimester  2. Placenta previa in second trimester Repeat US scheduled for 32 weeks Reviewed in detail previa, reviewed pelvic precautions   Preterm labor symptoms and general obstetric precautions including but not limited to vaginal bleeding, contractions, leaking of fluid and fetal movement were reviewed in detail with the patient. Please refer to After Visit Summary for other counseling recommendations.  Return in about 4 weeks (around 08/23/2018) for OB visit  (MD).  Future Appointments  Date Time Provider Department Center  10/08/2018 10:30 AM [facility] [place] 1 [facility]    Conan Bowens, MD

## 2018-08-23 ENCOUNTER — Ambulatory Visit (INDEPENDENT_AMBULATORY_CARE_PROVIDER_SITE_OTHER): Payer: 59 | Admitting: Obstetrics & Gynecology

## 2018-08-23 ENCOUNTER — Other Ambulatory Visit: Payer: Self-pay

## 2018-08-23 VITALS — BP 118/75 | HR 86 | Wt 134.6 lb

## 2018-08-23 DIAGNOSIS — Z3402 Encounter for supervision of normal first pregnancy, second trimester: Secondary | ICD-10-CM

## 2018-08-23 NOTE — Patient Instructions (Signed)

## 2018-08-23 NOTE — Progress Notes (Signed)
   PRENATAL VISIT NOTE  Subjective:  Heidi Fritz is a 28 y.o. G1P0 at [redacted]w[redacted]d being seen today for ongoing prenatal care.  She is currently monitored for the following issues for this high-risk pregnancy and has Supervision of normal pregnancy and Placenta previa on their problem list.  Patient reports no complaints.  Contractions: Not present. Vag. Bleeding: None.  Movement: Present. Denies leaking of fluid.   The following portions of the patient's history were reviewed and updated as appropriate: allergies, current medications, past family history, past medical history, past social history, past surgical history and problem list. Problem list updated.  Objective:   Vitals:   08/23/18 1009  BP: 118/75  Pulse: 86  Weight: 134 lb 9.6 oz (61.1 kg)    Fetal Status: Fetal Heart Rate (bpm): 154   Movement: Present     General:  Alert, oriented and cooperative. Patient is in no acute distress.  Skin: Skin is warm and dry. No rash noted.   Cardiovascular: Normal heart rate noted  Respiratory: Normal respiratory effort, no problems with respiration noted  Abdomen: Soft, gravid, appropriate for gestational age.  Pain/Pressure: Present     Pelvic: Cervical exam deferred        Extremities: Normal range of motion.  Edema: None  Mental Status: Normal mood and affect. Normal behavior. Normal judgment and thought content.   Assessment and Plan:  Pregnancy: G1P0 at [redacted]w[redacted]d  1. Encounter for supervision of normal first pregnancy in second trimester Placenta previa precautions, with [place] f/u scheduled  Preterm labor symptoms and general obstetric precautions including but not limited to vaginal bleeding, contractions, leaking of fluid and fetal movement were reviewed in detail with the patient. Please refer to After Visit Summary for other counseling recommendations.  Return in about 2 weeks (around 09/06/2018) for 2 hr GTT.  Future Appointments  Date Time Provider Department Center  10/08/2018  10:30 AM [facility] [place] 1 [facility]    Scheryl Darter, MD

## 2018-09-06 ENCOUNTER — Other Ambulatory Visit: Payer: 59

## 2018-09-06 ENCOUNTER — Encounter: Payer: Self-pay | Admitting: Obstetrics and Gynecology

## 2018-09-06 ENCOUNTER — Ambulatory Visit (INDEPENDENT_AMBULATORY_CARE_PROVIDER_SITE_OTHER): Payer: 59 | Admitting: Obstetrics and Gynecology

## 2018-09-06 ENCOUNTER — Other Ambulatory Visit: Payer: Self-pay

## 2018-09-06 VITALS — BP 112/74 | HR 88 | Wt 138.0 lb

## 2018-09-06 DIAGNOSIS — Z3A27 27 weeks gestation of pregnancy: Secondary | ICD-10-CM

## 2018-09-06 DIAGNOSIS — O4402 Placenta previa specified as without hemorrhage, second trimester: Secondary | ICD-10-CM

## 2018-09-06 DIAGNOSIS — Z3402 Encounter for supervision of normal first pregnancy, second trimester: Secondary | ICD-10-CM

## 2018-09-06 NOTE — Progress Notes (Signed)
ROB.  Declined TDAP vaccine.

## 2018-09-06 NOTE — Progress Notes (Signed)
   PRENATAL VISIT NOTE  Subjective:  Heidi Fritz is a 28 y.o. G1P0 at [redacted]w[redacted]d being seen today for ongoing prenatal care.  She is currently monitored for the following issues for this low-risk pregnancy and has Supervision of normal pregnancy and Placenta previa on their problem list.  Patient reports no complaints.  Contractions: Not present. Vag. Bleeding: None.  Movement: Present. Denies leaking of fluid.   The following portions of the patient's history were reviewed and updated as appropriate: allergies, current medications, past family history, past medical history, past social history, past surgical history and problem list. Problem list updated.  Objective:   Vitals:   09/06/18 0812  BP: 112/74  Pulse: 88  Weight: 138 lb (62.6 kg)    Fetal Status: Fetal Heart Rate (bpm): 150 Fundal Height: 28 cm Movement: Present     General:  Alert, oriented and cooperative. Patient is in no acute distress.  Skin: Skin is warm and dry. No rash noted.   Cardiovascular: Normal heart rate noted  Respiratory: Normal respiratory effort, no problems with respiration noted  Abdomen: Soft, gravid, appropriate for gestational age.  Pain/Pressure: Absent     Pelvic: Cervical exam deferred        Extremities: Normal range of motion.  Edema: None  Mental Status: Normal mood and affect. Normal behavior. Normal judgment and thought content.   Assessment and Plan:  Pregnancy: G1P0 at [redacted]w[redacted]d  1. Encounter for supervision of normal first pregnancy in second trimester Patient is doing well without complaints Third trimester labs today  2. Placenta previa in second trimester Follow up ultrasound March 16 Pelvic rest instructions reviewed  Preterm labor symptoms and general obstetric precautions including but not limited to vaginal bleeding, contractions, leaking of fluid and fetal movement were reviewed in detail with the patient. Please refer to After Visit Summary for other counseling  recommendations.  No follow-ups on file.  Future Appointments  Date Time Provider Department Center  10/08/2018 10:30 AM [facility] [place] 1 [facility]    Catalina Antigua, MD

## 2018-09-07 LAB — CBC
HEMOGLOBIN: 11.6 g/dL (ref 11.1–15.9)
Hematocrit: 33.5 % — ABNORMAL LOW (ref 34.0–46.6)
MCH: 31 pg (ref 26.6–33.0)
MCHC: 34.6 g/dL (ref 31.5–35.7)
MCV: 90 fL (ref 79–97)
Platelets: 309 10*3/uL (ref 150–450)
RBC: 3.74 x10E6/uL — ABNORMAL LOW (ref 3.77–5.28)
RDW: 12.2 % (ref 11.7–15.4)
WBC: 8.7 10*3/uL (ref 3.4–10.8)

## 2018-09-07 LAB — RPR: RPR Ser Ql: NONREACTIVE

## 2018-09-07 LAB — GLUCOSE TOLERANCE, 2 HOURS W/ 1HR
GLUCOSE, 1 HOUR: 129 mg/dL (ref 65–179)
Glucose, 2 hour: 94 mg/dL (ref 65–152)
Glucose, Fasting: 73 mg/dL (ref 65–91)

## 2018-09-07 LAB — HIV ANTIBODY (ROUTINE TESTING W REFLEX): HIV Screen 4th Generation wRfx: NONREACTIVE

## 2018-09-19 ENCOUNTER — Ambulatory Visit (INDEPENDENT_AMBULATORY_CARE_PROVIDER_SITE_OTHER): Payer: 59 | Admitting: Obstetrics and Gynecology

## 2018-09-19 ENCOUNTER — Encounter: Payer: Self-pay | Admitting: Obstetrics and Gynecology

## 2018-09-19 VITALS — BP 118/74 | HR 98 | Wt 142.0 lb

## 2018-09-19 DIAGNOSIS — Z3A29 29 weeks gestation of pregnancy: Secondary | ICD-10-CM

## 2018-09-19 DIAGNOSIS — O4403 Placenta previa specified as without hemorrhage, third trimester: Secondary | ICD-10-CM

## 2018-09-19 DIAGNOSIS — O4402 Placenta previa specified as without hemorrhage, second trimester: Secondary | ICD-10-CM

## 2018-09-19 DIAGNOSIS — Z3402 Encounter for supervision of normal first pregnancy, second trimester: Secondary | ICD-10-CM

## 2018-09-19 DIAGNOSIS — Z3403 Encounter for supervision of normal first pregnancy, third trimester: Secondary | ICD-10-CM

## 2018-09-19 NOTE — Progress Notes (Signed)
Subjective:  Heidi Fritz is a 28 y.o. G1P0 at [redacted]w[redacted]d being seen today for ongoing prenatal care.  She is currently monitored for the following issues for this high-risk pregnancy and has Supervision of normal pregnancy and Placenta previa on their problem list.  Patient reports no complaints.  Contractions: Not present. Vag. Bleeding: None.  Movement: Present. Denies leaking of fluid.   The following portions of the patient's history were reviewed and updated as appropriate: allergies, current medications, past family history, past medical history, past social history, past surgical history and problem list. Problem list updated.  Objective:   Vitals:   09/19/18 1125  BP: 118/74  Pulse: 98  Weight: 142 lb (64.4 kg)    Fetal Status: Fetal Heart Rate (bpm): 140   Movement: Present     General:  Alert, oriented and cooperative. Patient is in no acute distress.  Skin: Skin is warm and dry. No rash noted.   Cardiovascular: Normal heart rate noted  Respiratory: Normal respiratory effort, no problems with respiration noted  Abdomen: Soft, gravid, appropriate for gestational age. Pain/Pressure: Present     Pelvic:  Cervical exam deferred        Extremities: Normal range of motion.     Mental Status: Normal mood and affect. Normal behavior. Normal judgment and thought content.   Urinalysis:      Assessment and Plan:  Pregnancy: G1P0 at [redacted]w[redacted]d  1. Encounter for supervision of normal first pregnancy in second trimester Stable  2. Placenta previa in second trimester No bleeding Pelvic rest reviewed F/U U/S 10/08/18  Preterm labor symptoms and general obstetric precautions including but not limited to vaginal bleeding, contractions, leaking of fluid and fetal movement were reviewed in detail with the patient. Please refer to After Visit Summary for other counseling recommendations.  Return in about 2 weeks (around 10/03/2018) for OB visit.   Hermina Staggers, MD

## 2018-09-19 NOTE — Patient Instructions (Signed)
Third Trimester of Pregnancy The third trimester is from week 28 through week 40 (months 7 through 9). The third trimester is a time when the unborn baby (fetus) is growing rapidly. At the end of the ninth month, the fetus is about 20 inches in length and weighs 6-10 pounds. Body changes during your third trimester Your body will continue to go through many changes during pregnancy. The changes vary from woman to woman. During the third trimester:  Your weight will continue to increase. You can expect to gain 25-35 pounds (11-16 kg) by the end of the pregnancy.  You may begin to get stretch marks on your hips, abdomen, and breasts.  You may urinate more often because the fetus is moving lower into your pelvis and pressing on your bladder.  You may develop or continue to have heartburn. This is caused by increased hormones that slow down muscles in the digestive tract.  You may develop or continue to have constipation because increased hormones slow digestion and cause the muscles that push waste through your intestines to relax.  You may develop hemorrhoids. These are swollen veins (varicose veins) in the rectum that can itch or be painful.  You may develop swollen, bulging veins (varicose veins) in your legs.  You may have increased body aches in the pelvis, back, or thighs. This is due to weight gain and increased hormones that are relaxing your joints.  You may have changes in your hair. These can include thickening of your hair, rapid growth, and changes in texture. Some women also have hair loss during or after pregnancy, or hair that feels dry or thin. Your hair will most likely return to normal after your baby is born.  Your breasts will continue to grow and they will continue to become tender. A yellow fluid (colostrum) may leak from your breasts. This is the first milk you are producing for your baby.  Your belly button may stick out.  You may notice more swelling in your hands,  face, or ankles.  You may have increased tingling or numbness in your hands, arms, and legs. The skin on your belly may also feel numb.  You may feel short of breath because of your expanding uterus.  You may have more problems sleeping. This can be caused by the size of your belly, increased need to urinate, and an increase in your body's metabolism.  You may notice the fetus "dropping," or moving lower in your abdomen (lightening).  You may have increased vaginal discharge.  You may notice your joints feel loose and you may have pain around your pelvic bone. What to expect at prenatal visits You will have prenatal exams every 2 weeks until week 36. Then you will have weekly prenatal exams. During a routine prenatal visit:  You will be weighed to make sure you and the baby are growing normally.  Your blood pressure will be taken.  Your abdomen will be measured to track your baby's growth.  The fetal heartbeat will be listened to.  Any test results from the previous visit will be discussed.  You may have a cervical check near your due date to see if your cervix has softened or thinned (effaced).  You will be tested for Group B streptococcus. This happens between 35 and 37 weeks. Your health care provider may ask you:  What your birth plan is.  How you are feeling.  If you are feeling the baby move.  If you have had any abnormal   symptoms, such as leaking fluid, bleeding, severe headaches, or abdominal cramping.  If you are using any tobacco products, including cigarettes, chewing tobacco, and electronic cigarettes.  If you have any questions. Other tests or screenings that may be performed during your third trimester include:  Blood tests that check for low iron levels (anemia).  Fetal testing to check the health, activity level, and growth of the fetus. Testing is done if you have certain medical conditions or if there are problems during the pregnancy.  Nonstress test  (NST). This test checks the health of your baby to make sure there are no signs of problems, such as the baby not getting enough oxygen. During this test, a belt is placed around your belly. The baby is made to move, and its heart rate is monitored during movement. What is false labor? False labor is a condition in which you feel small, irregular tightenings of the muscles in the womb (contractions) that usually go away with rest, changing position, or drinking water. These are called Braxton Hicks contractions. Contractions may last for hours, days, or even weeks before true labor sets in. If contractions come at regular intervals, become more frequent, increase in intensity, or become painful, you should see your health care provider. What are the signs of labor?  Abdominal cramps.  Regular contractions that start at 10 minutes apart and become stronger and more frequent with time.  Contractions that start on the top of the uterus and spread down to the lower abdomen and back.  Increased pelvic pressure and dull back pain.  A watery or bloody mucus discharge that comes from the vagina.  Leaking of amniotic fluid. This is also known as your "water breaking." It could be a slow trickle or a gush. Let your health care provider know if it has a color or strange odor. If you have any of these signs, call your health care provider right away, even if it is before your due date. Follow these instructions at home: Medicines  Follow your health care provider's instructions regarding medicine use. Specific medicines may be either safe or unsafe to take during pregnancy.  Take a prenatal vitamin that contains at least 600 micrograms (mcg) of folic acid.  If you develop constipation, try taking a stool softener if your health care provider approves. Eating and drinking   Eat a balanced diet that includes fresh fruits and vegetables, whole grains, good sources of protein such as meat, eggs, or tofu,  and low-fat dairy. Your health care provider will help you determine the amount of weight gain that is right for you.  Avoid raw meat and uncooked cheese. These carry germs that can cause birth defects in the baby.  If you have low calcium intake from food, talk to your health care provider about whether you should take a daily calcium supplement.  Eat four or five small meals rather than three large meals a day.  Limit foods that are high in fat and processed sugars, such as fried and sweet foods.  To prevent constipation: ? Drink enough fluid to keep your urine clear or pale yellow. ? Eat foods that are high in fiber, such as fresh fruits and vegetables, whole grains, and beans. Activity  Exercise only as directed by your health care provider. Most women can continue their usual exercise routine during pregnancy. Try to exercise for 30 minutes at least 5 days a week. Stop exercising if you experience uterine contractions.  Avoid heavy lifting.  Do   not exercise in extreme heat or humidity, or at high altitudes.  Wear low-heel, comfortable shoes.  Practice good posture.  You may continue to have sex unless your health care provider tells you otherwise. Relieving pain and discomfort  Take frequent breaks and rest with your legs elevated if you have leg cramps or low back pain.  Take warm sitz baths to soothe any pain or discomfort caused by hemorrhoids. Use hemorrhoid cream if your health care provider approves.  Wear a good support bra to prevent discomfort from breast tenderness.  If you develop varicose veins: ? Wear support pantyhose or compression stockings as told by your healthcare provider. ? Elevate your feet for 15 minutes, 3-4 times a day. Prenatal care  Write down your questions. Take them to your prenatal visits.  Keep all your prenatal visits as told by your health care provider. This is important. Safety  Wear your seat belt at all times when driving.  Make  a list of emergency phone numbers, including numbers for family, friends, the hospital, and police and fire departments. General instructions  Avoid cat litter boxes and soil used by cats. These carry germs that can cause birth defects in the baby. If you have a cat, ask someone to clean the litter box for you.  Do not travel far distances unless it is absolutely necessary and only with the approval of your health care provider.  Do not use hot tubs, steam rooms, or saunas.  Do not drink alcohol.  Do not use any products that contain nicotine or tobacco, such as cigarettes and e-cigarettes. If you need help quitting, ask your health care provider.  Do not use any medicinal herbs or unprescribed drugs. These chemicals affect the formation and growth of the baby.  Do not douche or use tampons or scented sanitary pads.  Do not cross your legs for long periods of time.  To prepare for the arrival of your baby: ? Take prenatal classes to understand, practice, and ask questions about labor and delivery. ? Make a trial run to the hospital. ? Visit the hospital and tour the maternity area. ? Arrange for maternity or paternity leave through employers. ? Arrange for family and friends to take care of pets while you are in the hospital. ? Purchase a rear-facing car seat and make sure you know how to install it in your car. ? Pack your hospital bag. ? Prepare the baby's nursery. Make sure to remove all pillows and stuffed animals from the baby's crib to prevent suffocation.  Visit your dentist if you have not gone during your pregnancy. Use a soft toothbrush to brush your teeth and be gentle when you floss. Contact a health care provider if:  You are unsure if you are in labor or if your water has broken.  You become dizzy.  You have mild pelvic cramps, pelvic pressure, or nagging pain in your abdominal area.  You have lower back pain.  You have persistent nausea, vomiting, or  diarrhea.  You have an unusual or bad smelling vaginal discharge.  You have pain when you urinate. Get help right away if:  Your water breaks before 37 weeks.  You have regular contractions less than 5 minutes apart before 37 weeks.  You have a fever.  You are leaking fluid from your vagina.  You have spotting or bleeding from your vagina.  You have severe abdominal pain or cramping.  You have rapid weight loss or weight gain.  You have   shortness of breath with chest pain.  You notice sudden or extreme swelling of your face, hands, ankles, feet, or legs.  Your baby makes fewer than 10 movements in 2 hours.  You have severe headaches that do not go away when you take medicine.  You have vision changes. Summary  The third trimester is from week 28 through week 40, months 7 through 9. The third trimester is a time when the unborn baby (fetus) is growing rapidly.  During the third trimester, your discomfort may increase as you and your baby continue to gain weight. You may have abdominal, leg, and back pain, sleeping problems, and an increased need to urinate.  During the third trimester your breasts will keep growing and they will continue to become tender. A yellow fluid (colostrum) may leak from your breasts. This is the first milk you are producing for your baby.  False labor is a condition in which you feel small, irregular tightenings of the muscles in the womb (contractions) that eventually go away. These are called Braxton Hicks contractions. Contractions may last for hours, days, or even weeks before true labor sets in.  Signs of labor can include: abdominal cramps; regular contractions that start at 10 minutes apart and become stronger and more frequent with time; watery or bloody mucus discharge that comes from the vagina; increased pelvic pressure and dull back pain; and leaking of amniotic fluid. This information is not intended to replace advice given to you by your  health care provider. Make sure you discuss any questions you have with your health care provider. Document Released: 07/05/2001 Document Revised: 08/16/2016 Document Reviewed: 08/16/2016 [employer] Interactive Patient Education  2019 [employer]  

## 2018-10-03 ENCOUNTER — Ambulatory Visit (INDEPENDENT_AMBULATORY_CARE_PROVIDER_SITE_OTHER): Payer: 59 | Admitting: Obstetrics & Gynecology

## 2018-10-03 ENCOUNTER — Encounter: Payer: Self-pay | Admitting: Obstetrics & Gynecology

## 2018-10-03 ENCOUNTER — Other Ambulatory Visit: Payer: Self-pay

## 2018-10-03 VITALS — BP 126/75 | HR 81 | Wt 144.0 lb

## 2018-10-03 DIAGNOSIS — O4403 Placenta previa specified as without hemorrhage, third trimester: Secondary | ICD-10-CM

## 2018-10-03 DIAGNOSIS — Z3A31 31 weeks gestation of pregnancy: Secondary | ICD-10-CM

## 2018-10-03 DIAGNOSIS — Z3402 Encounter for supervision of normal first pregnancy, second trimester: Secondary | ICD-10-CM

## 2018-10-03 NOTE — Patient Instructions (Signed)

## 2018-10-03 NOTE — Progress Notes (Signed)
Pt noted Headache a few days ago , no visual changes and denies any swelling.

## 2018-10-03 NOTE — Progress Notes (Signed)
   PRENATAL VISIT NOTE  Subjective:  Heidi Fritz is a 28 y.o. G1P0 at [redacted]w[redacted]d being seen today for ongoing prenatal care.  She is currently monitored for the following issues for this high-risk pregnancy and has Supervision of normal pregnancy and Placenta previa on their problem list.  Patient reports no complaints.  Contractions: Not present. Vag. Bleeding: None.  Movement: Present. Denies leaking of fluid.   The following portions of the patient's history were reviewed and updated as appropriate: allergies, current medications, past family history, past medical history, past social history, past surgical history and problem list.   Objective:   Vitals:   10/03/18 1020  BP: 126/75  Pulse: 81  Weight: 65.3 kg    Fetal Status: Fetal Heart Rate (bpm): 158 Fundal Height: 32 cm Movement: Present     General:  Alert, oriented and cooperative. Patient is in no acute distress.  Skin: Skin is warm and dry. No rash noted.   Cardiovascular: Normal heart rate noted  Respiratory: Normal respiratory effort, no problems with respiration noted  Abdomen: Soft, gravid, appropriate for gestational age.  Pain/Pressure: Absent     Pelvic: Cervical exam deferred        Extremities: Normal range of motion.  Edema: Trace  Mental Status: Normal mood and affect. Normal behavior. Normal judgment and thought content.   Assessment and Plan:  Pregnancy: G1P0 at [redacted]w[redacted]d 1. Encounter for supervision of normal first pregnancy in second trimester F/u US next week   2. Placenta previa in third trimester No bleeding reported  Preterm labor symptoms and general obstetric precautions including but not limited to vaginal bleeding, contractions, leaking of fluid and fetal movement were reviewed in detail with the patient. Please refer to After Visit Summary for other counseling recommendations.   Return in about 2 weeks (around 10/17/2018).  Future Appointments  Date Time Provider Department Center  10/08/2018  10:15 AM [facility] NURSE [facility]  10/08/2018 10:30 AM [facility] [place] 1 [facility]    Scheryl Darter, MD

## 2018-10-04 ENCOUNTER — Telehealth: Payer: Self-pay

## 2018-10-04 NOTE — Telephone Encounter (Signed)
Patient called and states that her throat has been sore for two days along with symptoms of headache, fever, chills, and nausea. I advised pt go to urgent care or PCP to be evaluated. Pt verbalizes understanding.

## 2018-10-08 ENCOUNTER — Other Ambulatory Visit (HOSPITAL_COMMUNITY): Payer: Self-pay | Admitting: *Deleted

## 2018-10-08 ENCOUNTER — Ambulatory Visit (HOSPITAL_COMMUNITY)
Admission: RE | Admit: 2018-10-08 | Discharge: 2018-10-08 | Disposition: A | Discharge status: Home / Self Care | Payer: 59 | Source: Ambulatory Visit | Attending: Obstetrics and Gynecology | Admitting: Obstetrics and Gynecology

## 2018-10-08 ENCOUNTER — Other Ambulatory Visit: Payer: Self-pay

## 2018-10-08 ENCOUNTER — Encounter (HOSPITAL_COMMUNITY): Payer: Self-pay

## 2018-10-08 ENCOUNTER — Ambulatory Visit (HOSPITAL_COMMUNITY): Payer: 59 | Admitting: *Deleted

## 2018-10-08 VITALS — BP 119/76 | HR 92 | Temp 98.2°F | Wt 145.2 lb

## 2018-10-08 DIAGNOSIS — O4403 Placenta previa specified as without hemorrhage, third trimester: Secondary | ICD-10-CM

## 2018-10-08 DIAGNOSIS — O4440 Low lying placenta NOS or without hemorrhage, unspecified trimester: Secondary | ICD-10-CM | POA: Insufficient documentation

## 2018-10-08 DIAGNOSIS — O359XX0 Maternal care for (suspected) fetal abnormality and damage, unspecified, not applicable or unspecified: Secondary | ICD-10-CM

## 2018-10-08 DIAGNOSIS — O09893 Supervision of other high risk pregnancies, third trimester: Secondary | ICD-10-CM

## 2018-10-08 DIAGNOSIS — Z3686 Encounter for antenatal screening for cervical length: Secondary | ICD-10-CM

## 2018-10-08 DIAGNOSIS — Z362 Encounter for other antenatal screening follow-up: Secondary | ICD-10-CM | POA: Diagnosis not present

## 2018-10-08 DIAGNOSIS — Z3A32 32 weeks gestation of pregnancy: Secondary | ICD-10-CM

## 2018-10-17 ENCOUNTER — Ambulatory Visit (INDEPENDENT_AMBULATORY_CARE_PROVIDER_SITE_OTHER): Payer: 59 | Admitting: Obstetrics and Gynecology

## 2018-10-17 ENCOUNTER — Encounter: Payer: Self-pay | Admitting: Obstetrics and Gynecology

## 2018-10-17 DIAGNOSIS — Z3402 Encounter for supervision of normal first pregnancy, second trimester: Secondary | ICD-10-CM

## 2018-10-17 DIAGNOSIS — Z3A33 33 weeks gestation of pregnancy: Secondary | ICD-10-CM

## 2018-10-17 DIAGNOSIS — O4403 Placenta previa specified as without hemorrhage, third trimester: Secondary | ICD-10-CM

## 2018-10-17 NOTE — Progress Notes (Signed)
   TELEHEALTH VIRTUAL OBSTETRICS VISIT ENCOUNTER NOTE  I connected with Alvis Lemmings on 10/17/18 at 10:30 AM EDT by telephone at home and verified that I am speaking with the correct person using two identifiers.   I discussed the limitations, risks, security and privacy concerns of performing an evaluation and management service by telephone and the availability of in person appointments. I also discussed with the patient that there may be a patient responsible charge related to this service. The patient expressed understanding and agreed to proceed.  Subjective:  Heidi Fritz is a 28 y.o. G1P0 at [redacted]w[redacted]d being followed for ongoing prenatal care.  She is currently monitored for the following issues for this high-risk pregnancy and has Supervision of normal pregnancy and Placenta previa on their problem list.  Patient reports no complaints. Reports fetal movement. Denies any contractions, bleeding or leaking of fluid.   The following portions of the patient's history were reviewed and updated as appropriate: allergies, current medications, past family history, past medical history, past social history, past surgical history and problem list.    Objective:   General:  Alert, oriented and cooperative.   Mental Status: Normal mood and affect perceived. Normal judgment and thought content.  Rest of physical exam deferred due to type of encounter  Assessment and Plan:  Pregnancy: G1P0 at [redacted]w[redacted]d  1. Encounter for supervision of normal first pregnancy in second trimester  2. Placenta previa in third trimester - Will schedule for 1CS at 37 weeks, reviewed today that it will be canceled if necessary (if placenta moves) - Has f/u TVUS scheduled for 10/29/18 to eval placenta   Preterm labor symptoms and general obstetric precautions including but not limited to vaginal bleeding, contractions, leaking of fluid and fetal movement were reviewed in detail with the patient.  I discussed the assessment  and treatment plan with the patient. The patient was provided an opportunity to ask questions and all were answered. The patient agreed with the plan and demonstrated an understanding of the instructions. The patient was advised to call back or seek an in-person office evaluation/go to [facility]U at [facility] for any urgent or concerning symptoms. Please refer to After Visit Summary for other counseling recommendations.   Return in about 2 weeks (around 10/31/2018) for OB visit (MD).  Future Appointments  Date Time Provider Department Center  10/29/2018  1:15 PM [facility] NURSE [facility]  10/29/2018  1:15 PM [facility] [place] 4 [facility]-US  10/31/2018 10:15 AM Hermina Staggers, MD [facility] None    Conan Bowens, MD Center for [facility], [facility]

## 2018-10-17 NOTE — Progress Notes (Signed)
S/w pt via tele-visit. Pt reports fetal movement, denies pain.

## 2018-10-26 ENCOUNTER — Encounter (HOSPITAL_COMMUNITY): Payer: Self-pay

## 2018-10-26 NOTE — Patient Instructions (Addendum)
Heidi Fritz  10/26/2018   Your procedure is scheduled on:  11/10/2018  Arrive at 1000 at Entrance C on [address] at [facility] and [employer]. You are invited to use the FREE valet parking or use the Visitor's parking deck.  Pick up the phone at the desk and dial [phone].  Call this number if you have problems the morning of surgery: [phone]  Remember:   Do not eat food:(After Midnight) Desps de medianoche.  Do not drink clear liquids: (After Midnight) Desps de medianoche.  Take these medicines the morning of surgery with A SIP OF WATER:  none   Do not wear jewelry, make-up or nail polish.  Do not wear lotions, powders, or perfumes. Do not wear deodorant.  Do not shave 48 hours prior to surgery.  Do not bring valuables to the hospital.  [facility] is not   responsible for any belongings or valuables brought to the hospital.  Contacts, dentures or bridgework may not be worn into surgery.  Leave suitcase in the car. After surgery it may be brought to your room.  For patients admitted to the hospital, checkout time is 11:00 AM the day of              discharge.      Please read over the following fact sheets that you were given:     Preparing for Surgery

## 2018-10-29 ENCOUNTER — Other Ambulatory Visit: Payer: Self-pay

## 2018-10-29 ENCOUNTER — Encounter (HOSPITAL_COMMUNITY): Payer: Self-pay | Admitting: *Deleted

## 2018-10-29 ENCOUNTER — Ambulatory Visit (HOSPITAL_COMMUNITY)
Admission: RE | Admit: 2018-10-29 | Discharge: 2018-10-29 | Disposition: A | Discharge status: Home / Self Care | Payer: 59 | Source: Ambulatory Visit | Attending: Obstetrics and Gynecology | Admitting: Obstetrics and Gynecology

## 2018-10-29 ENCOUNTER — Ambulatory Visit (HOSPITAL_COMMUNITY): Payer: 59 | Admitting: *Deleted

## 2018-10-29 DIAGNOSIS — O4403 Placenta previa specified as without hemorrhage, third trimester: Secondary | ICD-10-CM | POA: Diagnosis not present

## 2018-10-29 DIAGNOSIS — O359XX0 Maternal care for (suspected) fetal abnormality and damage, unspecified, not applicable or unspecified: Secondary | ICD-10-CM

## 2018-10-29 DIAGNOSIS — O09893 Supervision of other high risk pregnancies, third trimester: Secondary | ICD-10-CM | POA: Diagnosis not present

## 2018-10-29 DIAGNOSIS — Z3A35 35 weeks gestation of pregnancy: Secondary | ICD-10-CM

## 2018-10-29 DIAGNOSIS — Z3686 Encounter for antenatal screening for cervical length: Secondary | ICD-10-CM | POA: Diagnosis not present

## 2018-10-31 ENCOUNTER — Encounter: Payer: Self-pay | Admitting: Obstetrics and Gynecology

## 2018-10-31 ENCOUNTER — Ambulatory Visit (INDEPENDENT_AMBULATORY_CARE_PROVIDER_SITE_OTHER): Payer: 59 | Admitting: Obstetrics and Gynecology

## 2018-10-31 ENCOUNTER — Other Ambulatory Visit: Payer: Self-pay

## 2018-10-31 VITALS — BP 127/82 | HR 81 | Wt 154.0 lb

## 2018-10-31 DIAGNOSIS — Z113 Encounter for screening for infections with a predominantly sexual mode of transmission: Secondary | ICD-10-CM | POA: Diagnosis not present

## 2018-10-31 DIAGNOSIS — O4403 Placenta previa specified as without hemorrhage, third trimester: Secondary | ICD-10-CM

## 2018-10-31 DIAGNOSIS — Z3403 Encounter for supervision of normal first pregnancy, third trimester: Secondary | ICD-10-CM

## 2018-10-31 DIAGNOSIS — Z3A35 35 weeks gestation of pregnancy: Secondary | ICD-10-CM

## 2018-10-31 NOTE — Progress Notes (Signed)
Subjective:  Heidi Fritz is a 28 y.o. G1P0 at [redacted]w[redacted]d being seen today for ongoing prenatal care.  She is currently monitored for the following issues for this high-risk pregnancy and has Supervision of normal pregnancy and Placenta previa on their problem list.  Patient reports general discomforts of pregnancy.  Contractions: Not present. Vag. Bleeding: None.  Movement: Present. Denies leaking of fluid.   The following portions of the patient's history were reviewed and updated as appropriate: allergies, current medications, past family history, past medical history, past social history, past surgical history and problem list. Problem list updated.  Objective:   Vitals:   10/31/18 1020  BP: 127/82  Pulse: 81  Weight: 154 lb (69.9 kg)    Fetal Status: Fetal Heart Rate (bpm): 141   Movement: Present     General:  Alert, oriented and cooperative. Patient is in no acute distress.  Skin: Skin is warm and dry. No rash noted.   Cardiovascular: Normal heart rate noted  Respiratory: Normal respiratory effort, no problems with respiration noted  Abdomen: Soft, gravid, appropriate for gestational age. Pain/Pressure: Present     Pelvic:  Cervical exam deferred        Extremities: Normal range of motion.  Edema: None  Mental Status: Normal mood and affect. Normal behavior. Normal judgment and thought content.   Urinalysis:      Assessment and Plan:  Pregnancy: G1P0 at [redacted]w[redacted]d  1. Encounter for supervision of normal first pregnancy in third trimester Labor precautions reviewed - Strep Gp B NAA - Cervicovaginal ancillary only( [place])  2. Placenta previa in third trimester Pelvic rest C section 11/10/18 - Strep Gp B NAA - Cervicovaginal ancillary only( [place])  Preterm labor symptoms and general obstetric precautions including but not limited to vaginal bleeding, contractions, leaking of fluid and fetal movement were reviewed in detail with the patient. Please refer to After  Visit Summary for other counseling recommendations.  No follow-ups on file.   Hermina Staggers, MD

## 2018-10-31 NOTE — Progress Notes (Signed)
ROB. Placenta Previa.  CS 11/10/18

## 2018-10-31 NOTE — Patient Instructions (Signed)

## 2018-11-01 LAB — CERVICOVAGINAL ANCILLARY ONLY
Chlamydia: NEGATIVE
Neisseria Gonorrhea: NEGATIVE

## 2018-11-02 LAB — STREP GP B NAA: Strep Gp B NAA: POSITIVE — AB

## 2018-11-06 NOTE — H&P (Signed)
Heidi Fritz is a 28 y.o. female G1P0 IUP 37 weeks presenting for primary c section d/t placenta previa.  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 Pt reports + FM, denies VB, LOF or ut ctx.  OB History    Gravida  1   Para      Term      Preterm      AB      Living        SAB      TAB      Ectopic      Multiple      Live Births             Past Medical History:  Diagnosis Date  . Anxiety    Past Surgical History:  Procedure Laterality Date  . I&D EXTREMITY Left 02/19/2016   Procedure: IRRIGATION AND DEBRIDEMENT  AND REPAIR AS NECESSARY ARM , WRIST;  Surgeon: Dominica Severin, MD;  Location: [facility] OR;  Service: Orthopedics;  Laterality: Left;  . NERVE REPAIR Left 02/19/2016   Procedure: NERVE REPAIR;  Surgeon: Dominica Severin, MD;  Location: [facility] OR;  Service: Orthopedics;  Laterality: Left;  . TENDON REPAIR Left 02/19/2016   Procedure: TENDON REPAIR;  Surgeon: Dominica Severin, MD;  Location: [facility] OR;  Service: Orthopedics;  Laterality: Left;   Family History: family history includes Diabetes in her maternal grandfather, maternal grandmother, paternal grandfather, and paternal grandmother; Hypertension in her maternal grandfather, maternal grandmother, paternal grandfather, and paternal grandmother. Social History:  reports that she has never smoked. She has never used smokeless tobacco. She reports previous alcohol use. She reports previous drug use. Frequency: 7.00 times per week. Drug: Marijuana.     Maternal Diabetes: No Genetic Screening: Abnormal:  Results: Other: Maternal Ultrasounds/Referrals: Abnormal:  Findings:   Other: Fetal Ultrasounds or other Referrals:  None Maternal Substance Abuse:  No Significant Maternal Medications:  None Significant Maternal Lab Results:  None Other Comments:  None  Review of Systems  Constitutional: Negative.   Eyes: Negative.   Respiratory: Negative.   Cardiovascular: Negative.   Gastrointestinal:  Negative.   Genitourinary: Negative.    History   Last menstrual period 02/24/2018. Exam Physical Exam  Constitutional: She appears well-developed and well-nourished.  HENT:  Head: Normocephalic.  Neck: Normal range of motion. Neck supple.  Cardiovascular: Normal rate and regular rhythm.  Respiratory: Effort normal and breath sounds normal.  GI: Soft. Bowel sounds are normal.  Gravid S = D  Genitourinary:    Genitourinary Comments: Deferred   Musculoskeletal:     Comments: Trace edema    Prenatal labs: ABO, Rh: A/Positive/-- (10/10 1602) Antibody: Negative (10/10 1602) Rubella: 1.46 (10/10 1602) RPR: Non Reactive (02/13 1020)  HBsAg: Negative (10/10 1602)  HIV: Non Reactive (02/13 1020)  GBS: Positive (04/08 1039)   Assessment/Plan: IUP 37 weeks Placenta previa CF carrier  The risks of cesarean section discussed with the patient included but were not limited to: bleeding which may require transfusion or reoperation; infection which may require antibiotics; injury to bowel, bladder, ureters or other surrounding organs; injury to the fetus; need for additional procedures including hysterectomy in the event of a life-threatening hemorrhage; placental abnormalities wth subsequent pregnancies, incisional problems, thromboembolic phenomenon and other postoperative/anesthesia complications. The patient concurred with the proposed plan, giving informed written consent for the procedure.   Anesthesia and OR aware. Preoperative prophylactic antibiotics and SCDs ordered on call to the OR.  To OR when ready.  Jennife Zaucha L. Alysia Penna,  MD     Heidi StaggersMichael L Marka Fritz 11/06/2018, 10:43 AM

## 2018-11-09 ENCOUNTER — Encounter (HOSPITAL_COMMUNITY)
Admission: RE | Admit: 2018-11-09 | Discharge: 2018-11-09 | Disposition: A | Discharge status: Home / Self Care | Payer: 59 | Source: Ambulatory Visit

## 2018-11-10 ENCOUNTER — Other Ambulatory Visit: Payer: Self-pay

## 2018-11-10 ENCOUNTER — Inpatient Hospital Stay (HOSPITAL_COMMUNITY): Payer: 59 | Admitting: Anesthesiology

## 2018-11-10 ENCOUNTER — Encounter (HOSPITAL_COMMUNITY)
Admission: RE | Disposition: A | Discharge status: Home / Self Care | Payer: Self-pay | Source: Home / Self Care | Attending: Obstetrics and Gynecology

## 2018-11-10 ENCOUNTER — Encounter (HOSPITAL_COMMUNITY): Payer: Self-pay

## 2018-11-10 ENCOUNTER — Inpatient Hospital Stay (HOSPITAL_COMMUNITY)
Admission: RE | Admit: 2018-11-10 | Discharge: 2018-11-13 | DRG: 788 | Disposition: A | Discharge status: Home / Self Care | Payer: 59 | Attending: Obstetrics and Gynecology | Admitting: Obstetrics and Gynecology

## 2018-11-10 DIAGNOSIS — Z3A37 37 weeks gestation of pregnancy: Secondary | ICD-10-CM

## 2018-11-10 DIAGNOSIS — O4400 Placenta previa specified as without hemorrhage, unspecified trimester: Secondary | ICD-10-CM | POA: Diagnosis present

## 2018-11-10 DIAGNOSIS — Z141 Cystic fibrosis carrier: Secondary | ICD-10-CM | POA: Diagnosis not present

## 2018-11-10 DIAGNOSIS — Z98891 History of uterine scar from previous surgery: Secondary | ICD-10-CM

## 2018-11-10 DIAGNOSIS — O99834 Other infection carrier state complicating childbirth: Secondary | ICD-10-CM

## 2018-11-10 DIAGNOSIS — O4403 Placenta previa specified as without hemorrhage, third trimester: Principal | ICD-10-CM | POA: Diagnosis present

## 2018-11-10 LAB — CREATININE, SERUM
Creatinine, Ser: 0.58 mg/dL (ref 0.44–1.00)
GFR calc Af Amer: >60 mL/min (ref >60)
GFR calc non Af Amer: >60 mL/min (ref >60)

## 2018-11-10 LAB — CBC
HCT: 39.9 % (ref 36.0–46.0)
Hemoglobin: 13.2 g/dL (ref 12.0–15.0)
MCH: 30.1 pg (ref 26.0–34.0)
MCHC: 33.1 g/dL (ref 30.0–36.0)
MCV: 90.9 fL (ref 80.0–100.0)
Platelets: 239 10*3/uL (ref 150–400)
RBC: 4.39 MIL/uL (ref 3.87–5.11)
RDW: 12.4 % (ref 11.5–15.5)
WBC: 9.4 10*3/uL (ref 4.0–10.5)
nRBC: 0 % (ref 0.0–0.2)

## 2018-11-10 LAB — TYPE AND SCREEN
ABO/RH(D): A POS
Antibody Screen: NEGATIVE

## 2018-11-10 SURGERY — Surgical Case
Anesthesia: Spinal

## 2018-11-10 MED ORDER — BUPIVACAINE IN DEXTROSE 0.75-8.25 % IT SOLN
INTRATHECAL | Status: DC | PRN
  Administered 2018-11-10: 1.6 mL via INTRATHECAL

## 2018-11-10 MED ORDER — SODIUM CHLORIDE 0.9 % IV SOLN
INTRAVENOUS | Status: DC | PRN
  Administered 2018-11-10: 40 [IU] via INTRAVENOUS

## 2018-11-10 MED ORDER — GABAPENTIN 300 MG PO CAPS
300.0000 mg | ORAL_CAPSULE | ORAL | Status: AC
  Administered 2018-11-10: 12:00:00 300 mg via ORAL

## 2018-11-10 MED ORDER — GABAPENTIN 300 MG PO CAPS
ORAL_CAPSULE | ORAL | Status: AC
  Filled 2018-11-10: qty 1

## 2018-11-10 MED ORDER — NALOXONE HCL 0.4 MG/ML IJ SOLN: 0.4000 mg | INTRAMUSCULAR | Status: DC | PRN

## 2018-11-10 MED ORDER — OXYCODONE HCL 5 MG PO TABS: 5.0000 mg | ORAL_TABLET | ORAL | Status: DC | PRN

## 2018-11-10 MED ORDER — KETOROLAC TROMETHAMINE 30 MG/ML IJ SOLN
30.0000 mg | Freq: Four times a day (QID) | INTRAMUSCULAR | Status: AC
  Administered 2018-11-10 – 2018-11-11 (×3): 30 mg via INTRAVENOUS
  Filled 2018-11-10 (×3): qty 1

## 2018-11-10 MED ORDER — DIPHENHYDRAMINE HCL 25 MG PO CAPS: 25.0000 mg | ORAL_CAPSULE | Freq: Four times a day (QID) | ORAL | Status: DC | PRN

## 2018-11-10 MED ORDER — SENNOSIDES-DOCUSATE SODIUM 8.6-50 MG PO TABS
2.0000 | ORAL_TABLET | ORAL | Status: DC
  Administered 2018-11-10 – 2018-11-12 (×3): 2 via ORAL
  Filled 2018-11-10 (×3): qty 2

## 2018-11-10 MED ORDER — SODIUM CHLORIDE 0.9% FLUSH: 3.0000 mL | INTRAVENOUS | Status: DC | PRN

## 2018-11-10 MED ORDER — DIPHENHYDRAMINE HCL 25 MG PO CAPS: 25.0000 mg | ORAL_CAPSULE | ORAL | Status: DC | PRN

## 2018-11-10 MED ORDER — DEXAMETHASONE SODIUM PHOSPHATE 4 MG/ML IJ SOLN
INTRAMUSCULAR | Status: DC | PRN
  Administered 2018-11-10: 4 mg via INTRAVENOUS

## 2018-11-10 MED ORDER — DEXAMETHASONE SODIUM PHOSPHATE 4 MG/ML IJ SOLN
INTRAMUSCULAR | Status: AC
  Filled 2018-11-10: qty 1

## 2018-11-10 MED ORDER — MORPHINE SULFATE (PF) 0.5 MG/ML IJ SOLN
INTRAMUSCULAR | Status: AC
  Filled 2018-11-10: qty 10

## 2018-11-10 MED ORDER — COCONUT OIL OIL: 1.0000 "application " | TOPICAL_OIL | Status: DC | PRN

## 2018-11-10 MED ORDER — SOD CITRATE-CITRIC ACID 500-334 MG/5ML PO SOLN
ORAL | Status: AC
  Filled 2018-11-10: qty 30

## 2018-11-10 MED ORDER — OXYTOCIN 10 UNIT/ML IJ SOLN
INTRAMUSCULAR | Status: DC | PRN
  Administered 2018-11-10: 10 [IU] via INTRAMUSCULAR

## 2018-11-10 MED ORDER — ZOLPIDEM TARTRATE 5 MG PO TABS: 5.0000 mg | ORAL_TABLET | Freq: Every evening | ORAL | Status: DC | PRN

## 2018-11-10 MED ORDER — OXYTOCIN 40 UNITS IN NORMAL SALINE INFUSION - SIMPLE MED: 2.5000 [IU]/h | INTRAVENOUS | Status: AC

## 2018-11-10 MED ORDER — SIMETHICONE 80 MG PO CHEW
80.0000 mg | CHEWABLE_TABLET | Freq: Three times a day (TID) | ORAL | Status: DC
  Administered 2018-11-11 – 2018-11-13 (×7): 80 mg via ORAL
  Filled 2018-11-10 (×6): qty 1

## 2018-11-10 MED ORDER — LACTATED RINGERS IV SOLN
INTRAVENOUS | Status: DC
  Administered 2018-11-10 (×2): via INTRAVENOUS

## 2018-11-10 MED ORDER — OXYTOCIN 10 UNIT/ML IJ SOLN
INTRAMUSCULAR | Status: AC
  Filled 2018-11-10: qty 1

## 2018-11-10 MED ORDER — CEFAZOLIN SODIUM-DEXTROSE 2-4 GM/100ML-% IV SOLN
INTRAVENOUS | Status: AC
  Filled 2018-11-10: qty 100

## 2018-11-10 MED ORDER — NALOXONE HCL 4 MG/10ML IJ SOLN
1.0000 ug/kg/h | INTRAVENOUS | Status: DC | PRN
  Filled 2018-11-10: qty 5

## 2018-11-10 MED ORDER — KETOROLAC TROMETHAMINE 30 MG/ML IJ SOLN
INTRAMUSCULAR | Status: AC
  Filled 2018-11-10: qty 1

## 2018-11-10 MED ORDER — IBUPROFEN 800 MG PO TABS
800.0000 mg | ORAL_TABLET | Freq: Four times a day (QID) | ORAL | Status: DC
  Administered 2018-11-11 – 2018-11-13 (×8): 800 mg via ORAL
  Filled 2018-11-10 (×8): qty 1

## 2018-11-10 MED ORDER — SIMETHICONE 80 MG PO CHEW
80.0000 mg | CHEWABLE_TABLET | ORAL | Status: DC
  Administered 2018-11-10 – 2018-11-12 (×3): 80 mg via ORAL
  Filled 2018-11-10 (×3): qty 1

## 2018-11-10 MED ORDER — SIMETHICONE 80 MG PO CHEW: 80.0000 mg | CHEWABLE_TABLET | ORAL | Status: DC | PRN

## 2018-11-10 MED ORDER — OXYTOCIN 40 UNITS IN NORMAL SALINE INFUSION - SIMPLE MED
INTRAVENOUS | Status: AC
  Filled 2018-11-10: qty 1000

## 2018-11-10 MED ORDER — PHENYLEPHRINE HCL-NACL 20-0.9 MG/250ML-% IV SOLN
INTRAVENOUS | Status: AC
  Filled 2018-11-10: qty 250

## 2018-11-10 MED ORDER — WITCH HAZEL-GLYCERIN EX PADS: 1.0000 "application " | MEDICATED_PAD | CUTANEOUS | Status: DC | PRN

## 2018-11-10 MED ORDER — NALBUPHINE HCL 10 MG/ML IJ SOLN: 5.0000 mg | INTRAMUSCULAR | Status: DC | PRN

## 2018-11-10 MED ORDER — TRANEXAMIC ACID-NACL 1000-0.7 MG/100ML-% IV SOLN
1000.0000 mg | INTRAVENOUS | Status: AC
  Administered 2018-11-10: 1000 mg via INTRAVENOUS

## 2018-11-10 MED ORDER — DIPHENHYDRAMINE HCL 50 MG/ML IJ SOLN: 12.5000 mg | INTRAMUSCULAR | Status: DC | PRN

## 2018-11-10 MED ORDER — KETOROLAC TROMETHAMINE 15 MG/ML IJ SOLN
15.0000 mg | INTRAMUSCULAR | Status: DC
  Filled 2018-11-10 (×2): qty 1

## 2018-11-10 MED ORDER — SOD CITRATE-CITRIC ACID 500-334 MG/5ML PO SOLN
30.0000 mL | ORAL | Status: AC
  Administered 2018-11-10: 30 mL via ORAL

## 2018-11-10 MED ORDER — CEFAZOLIN SODIUM-DEXTROSE 2-4 GM/100ML-% IV SOLN
2.0000 g | INTRAVENOUS | Status: AC
  Administered 2018-11-10: 2 g via INTRAVENOUS

## 2018-11-10 MED ORDER — LACTATED RINGERS IV SOLN
INTRAVENOUS | Status: DC
  Administered 2018-11-10: 22:00:00 via INTRAVENOUS

## 2018-11-10 MED ORDER — PRENATAL MULTIVITAMIN CH
1.0000 | ORAL_TABLET | Freq: Every day | ORAL | Status: DC
  Administered 2018-11-11 – 2018-11-13 (×3): 1 via ORAL
  Filled 2018-11-10 (×3): qty 1

## 2018-11-10 MED ORDER — TETANUS-DIPHTH-ACELL PERTUSSIS 5-2.5-18.5 LF-MCG/0.5 IM SUSP: 0.5000 mL | Freq: Once | INTRAMUSCULAR | Status: DC

## 2018-11-10 MED ORDER — ACETAMINOPHEN 10 MG/ML IV SOLN: 1000.0000 mg | Freq: Once | INTRAVENOUS | Status: DC | PRN

## 2018-11-10 MED ORDER — ONDANSETRON HCL 4 MG/2ML IJ SOLN
INTRAMUSCULAR | Status: AC
  Filled 2018-11-10: qty 2

## 2018-11-10 MED ORDER — ENOXAPARIN SODIUM 40 MG/0.4ML ~~LOC~~ SOLN
40.0000 mg | SUBCUTANEOUS | Status: DC
  Administered 2018-11-11 – 2018-11-13 (×3): 40 mg via SUBCUTANEOUS
  Filled 2018-11-10 (×3): qty 0.4

## 2018-11-10 MED ORDER — NALBUPHINE HCL 10 MG/ML IJ SOLN: 5.0000 mg | Freq: Once | INTRAMUSCULAR | Status: DC | PRN

## 2018-11-10 MED ORDER — FENTANYL CITRATE (PF) 100 MCG/2ML IJ SOLN
INTRAMUSCULAR | Status: AC
  Filled 2018-11-10: qty 2

## 2018-11-10 MED ORDER — TRANEXAMIC ACID-NACL 1000-0.7 MG/100ML-% IV SOLN
INTRAVENOUS | Status: AC
  Filled 2018-11-10: qty 100

## 2018-11-10 MED ORDER — DIBUCAINE (PERIANAL) 1 % EX OINT: 1.0000 "application " | TOPICAL_OINTMENT | CUTANEOUS | Status: DC | PRN

## 2018-11-10 MED ORDER — SCOPOLAMINE 1 MG/3DAYS TD PT72: 1.0000 | MEDICATED_PATCH | Freq: Once | TRANSDERMAL | Status: DC

## 2018-11-10 MED ORDER — ONDANSETRON HCL 4 MG/2ML IJ SOLN
INTRAMUSCULAR | Status: DC | PRN
  Administered 2018-11-10: 4 mg via INTRAVENOUS

## 2018-11-10 MED ORDER — SODIUM CHLORIDE 0.9 % IV SOLN
INTRAVENOUS | Status: DC | PRN
  Administered 2018-11-10: 13:00:00 via INTRAVENOUS

## 2018-11-10 MED ORDER — MORPHINE SULFATE (PF) 0.5 MG/ML IJ SOLN
INTRAMUSCULAR | Status: DC | PRN
  Administered 2018-11-10: .15 mg via INTRATHECAL
  Administered 2018-11-10: .15 mg via EPIDURAL

## 2018-11-10 MED ORDER — FENTANYL CITRATE (PF) 100 MCG/2ML IJ SOLN
INTRAMUSCULAR | Status: DC | PRN
  Administered 2018-11-10: 15 ug via INTRAVENOUS
  Administered 2018-11-10: 15 ug via INTRATHECAL

## 2018-11-10 MED ORDER — PHENYLEPHRINE HCL-NACL 20-0.9 MG/250ML-% IV SOLN
INTRAVENOUS | Status: DC | PRN
  Administered 2018-11-10: 60 ug/min via INTRAVENOUS

## 2018-11-10 MED ORDER — ONDANSETRON HCL 4 MG/2ML IJ SOLN: 4.0000 mg | Freq: Three times a day (TID) | INTRAMUSCULAR | Status: DC | PRN

## 2018-11-10 MED ORDER — MENTHOL 3 MG MT LOZG: 1.0000 | LOZENGE | OROMUCOSAL | Status: DC | PRN

## 2018-11-10 SURGICAL SUPPLY — 41 items
APL SKNCLS STERI-STRIP NONHPOA (GAUZE/BANDAGES/DRESSINGS) ×1
BENZOIN TINCTURE PRP APPL 2/3 (GAUZE/BANDAGES/DRESSINGS) ×3 IMPLANT
CHLORAPREP W/TINT 26ML (MISCELLANEOUS) ×3 IMPLANT
CLAMP CORD UMBIL (MISCELLANEOUS) IMPLANT
CLOSURE WOUND 1/2 X4 (GAUZE/BANDAGES/DRESSINGS) ×1
CLOTH BEACON ORANGE TIMEOUT ST (SAFETY) ×3 IMPLANT
DRAPE C SECTION CLR SCREEN (DRAPES) IMPLANT
DRSG OPSITE POSTOP 4X10 (GAUZE/BANDAGES/DRESSINGS) ×3 IMPLANT
ELECT REM PT RETURN 9FT ADLT (ELECTROSURGICAL) ×3
ELECTRODE REM PT RTRN 9FT ADLT (ELECTROSURGICAL) ×1 IMPLANT
EXTRACTOR VACUUM M CUP 4 TUBE (SUCTIONS) IMPLANT
EXTRACTOR VACUUM M CUP 4' TUBE (SUCTIONS)
GLOVE BIO SURGEON STRL SZ7.5 (GLOVE) ×3 IMPLANT
GLOVE BIOGEL PI IND STRL 7.0 (GLOVE) ×1 IMPLANT
GLOVE BIOGEL PI INDICATOR 7.0 (GLOVE) ×2
GOWN STRL REUS W/TWL 2XL LVL3 (GOWN DISPOSABLE) ×3 IMPLANT
GOWN STRL REUS W/TWL LRG LVL3 (GOWN DISPOSABLE) ×6 IMPLANT
KIT ABG SYR 3ML LUER SLIP (SYRINGE) IMPLANT
NDL HYPO 25X5/8 SAFETYGLIDE (NEEDLE) IMPLANT
NEEDLE HYPO 22GX1.5 SAFETY (NEEDLE) ×3 IMPLANT
NEEDLE HYPO 25X5/8 SAFETYGLIDE (NEEDLE) IMPLANT
NS IRRIG 1000ML POUR BTL (IV SOLUTION) ×3 IMPLANT
PACK C SECTION WH (CUSTOM PROCEDURE TRAY) ×3 IMPLANT
PAD OB MATERNITY 4.3X12.25 (PERSONAL CARE ITEMS) ×3 IMPLANT
PENCIL SMOKE EVAC W/HOLSTER (ELECTROSURGICAL) ×3 IMPLANT
RTRCTR C-SECT PINK 25CM LRG (MISCELLANEOUS) ×3 IMPLANT
STRIP CLOSURE SKIN 1/2X4 (GAUZE/BANDAGES/DRESSINGS) ×2 IMPLANT
SUT CHROMIC 1 CTX 36 (SUTURE) ×6 IMPLANT
SUT VIC AB 1 CT1 36 (SUTURE) ×6 IMPLANT
SUT VIC AB 2-0 CT1 (SUTURE) ×3 IMPLANT
SUT VIC AB 2-0 CT1 27 (SUTURE) ×3
SUT VIC AB 2-0 CT1 TAPERPNT 27 (SUTURE) ×1 IMPLANT
SUT VIC AB 3-0 CT1 27 (SUTURE) ×6
SUT VIC AB 3-0 CT1 TAPERPNT 27 (SUTURE) ×2 IMPLANT
SUT VIC AB 3-0 SH 27 (SUTURE)
SUT VIC AB 3-0 SH 27X BRD (SUTURE) IMPLANT
SUT VIC AB 4-0 KS 27 (SUTURE) ×3 IMPLANT
SYR BULB IRRIGATION 50ML (SYRINGE) IMPLANT
TOWEL OR 17X24 6PK STRL BLUE (TOWEL DISPOSABLE) ×3 IMPLANT
TRAY FOLEY W/BAG SLVR 14FR LF (SET/KITS/TRAYS/PACK) ×3 IMPLANT
WATER STERILE IRR 1000ML POUR (IV SOLUTION) ×3 IMPLANT

## 2018-11-10 NOTE — Op Note (Signed)
Heidi LemmingsAshley K Fritz PROCEDURE DATE: 11/10/2018  PREOPERATIVE DIAGNOSES: Intrauterine pregnancy at 619w0d weeks gestation; placenta previa  POSTOPERATIVE DIAGNOSES: The same  PROCEDURE: Primary Low Transverse Cesarean Section  SURGEON: Dr. Nettie ElmMichael Ervin ASSISTANT:  Dr. Rhett BannisterLaurel Adasyn Mcadams  ANESTHESIOLOGY TEAM: Anesthesiologist: Elmer PickerWoodrum, Chelsey L, MD CRNA: Armanda HeritageSterling, Charlesetta M, CRNA  INDICATIONS: Heidi Fritz is a 28 y.o. G1P0 at 489w0d here for cesarean section secondary to the indications listed under preoperative diagnoses; please see preoperative note for further details.  The risks of cesarean section were discussed with the patient including but were not limited to: bleeding which may require transfusion or reoperation; infection which may require antibiotics; injury to bowel, bladder, ureters or other surrounding organs; injury to the fetus; need for additional procedures including hysterectomy in the event of a life-threatening hemorrhage; placental abnormalities wth subsequent pregnancies, incisional problems, thromboembolic phenomenon and other postoperative/anesthesia complications.  She consents to blood transfusion in the event of an emergency. The patient verbalized understanding of the plan, giving informed written consent for the procedure.    FINDINGS:  Viable female infant in cephalic presentation.  Apgars 7 and 9.  Clear amniotic fluid.  Intact placenta, three vessel cord.  Normal uterus, fallopian tubes and ovaries bilaterally.  ANESTHESIA: Spinal  INTRAVENOUS FLUIDS: 2600 ml   ESTIMATED BLOOD LOSS: 419cc ml URINE OUTPUT:  600 ml SPECIMENS: Placenta sent to pathology COMPLICATIONS: None immediate  PROCEDURE IN DETAIL:  The patient preoperatively received intravenous antibiotics and had sequential compression devices applied to her lower extremities.  She was then taken to the operating room where spinal anesthesia was administered and was found to be adequate. She was then placed  in a dorsal supine position with a leftward tilt, and prepped and draped in a sterile manner.  A foley catheter was placed into her bladder with sterile technique and attached to constant gravity.  After a timeout was performed, a Pfannenstiel skin incision was made with scalpel and carried through to the underlying layer of fascia. The fascia was incised in the midline, and this incision was extended bilaterally using the Mayo scissors.  Kocher clamps were applied to the superior aspect of the fascial incision and the underlying rectus muscles were dissected off bluntly and sharply.  A similar process was carried out on the inferior aspect of the fascial incision. The rectus muscles were separated in the midline bluntly and the peritoneum was entered sharply. The peritoneal incision was carefully extended bluntly laterally and caudad with good visualization of the bladder. The uterus appeared normal. The Alexis O-ring retractor was placed into the incision, taking care not to incorporate bowel or omentum. A bladder flap was created. Attention was then turned to the lower uterine segment where a low transverse hysterotomy was made with a scalpel and extended bilaterally bluntly. Placenta was immediately encountered, and the infant was delivered from vertex position, nose and mouth were bulb suctioned, and the cord clamped and cut after 30 seconds due to poor tone and respiratory effort. The infant was then handed over to the waiting neonatology team. Uterine massage was then performed, and the placenta delivered intact with a three-vessel cord. The uterus was then gently exteriorized and cleared of clots and debris.  The hysterotomy was closed with 0 Vicryl in a running locked fashion, and an imbricating layer was also placed with 0 Vicryl. A box stitch with 0 Vicryl serosal stitches were placed to help with hemostasis.  The fallopian tubes and ovaries were visualized bilaterally and normal appearing. The uterus  was  then gently replaced within the abdomen. The Alexis retractor was removed.  The pelvis was cleared of all clot and debris.  Hemostasis was again confirmed on all surfaces. The peritoneum was re-approximated using 2-0 Vicryl suture. The fascia was then closed using 0 Vicryl in a running fashion.  The subcutaneous layer was irrigated, then reapproximated with 2-0 plain gut.  The skin was closed with a 4-0 Monocryl subcuticular stitch. The patient tolerated the procedure well. Sponge, lap, instrument and needle counts were correct x 3.  She was taken to the recovery room in stable condition.   Heidi Deer. Earlene Plater, DO OB/GYN Fellow

## 2018-11-10 NOTE — Interval H&P Note (Signed)
History and Physical Interval Note:  11/10/2018 11:52 AM  Heidi Fritz  has presented today for surgery, with the diagnosis of Previa.  The various methods of treatment have been discussed with the patient and family. After consideration of risks, benefits and other options for treatment, the patient has consented to  Procedure(s): CESAREAN SECTION (N/A) as a surgical intervention.  The patient's history has been reviewed, patient examined, no change in status, stable for surgery.  I have reviewed the patient's chart and labs.  Questions were answered to the patient's satisfaction.     Hermina Staggers

## 2018-11-10 NOTE — Transfer of Care (Signed)
Immediate Anesthesia Transfer of Care Note  Patient: Heidi Fritz  Procedure(s) Performed: CESAREAN SECTION (N/A )  Patient Location: PACU  Anesthesia Type:Spinal  Level of Consciousness: awake, alert  and oriented  Airway & Oxygen Therapy: Patient Spontanous Breathing  Post-op Assessment: Report given to RN and Post -op Vital signs reviewed and stable  Post vital signs: Reviewed and stable  Last Vitals:  Vitals Value Taken Time  BP    Temp    Pulse 77 11/10/2018  1:50 PM  Resp 11 11/10/2018  1:50 PM  SpO2 99 % 11/10/2018  1:50 PM  Vitals shown include unvalidated device data.  Last Pain:  Vitals:   11/10/18 1020  TempSrc: Oral         Complications: No apparent anesthesia complications

## 2018-11-10 NOTE — Anesthesia Postprocedure Evaluation (Signed)
Anesthesia Post Note  Patient: Heidi Fritz  Procedure(s) Performed: CESAREAN SECTION (N/A )     Patient location during evaluation: PACU Anesthesia Type: Spinal Level of consciousness: oriented and awake and alert Pain management: pain level controlled Vital Signs Assessment: post-procedure vital signs reviewed and stable Respiratory status: spontaneous breathing, respiratory function stable and patient connected to nasal cannula oxygen Cardiovascular status: blood pressure returned to baseline and stable Postop Assessment: no headache, no backache and no apparent nausea or vomiting Anesthetic complications: no    Last Vitals:  Vitals:   11/10/18 1455 11/10/18 1515  BP:  128/76  Pulse: 71 70  Resp: 12 16  Temp:  36.9 C  SpO2: 99% 98%    Last Pain:  Vitals:   11/10/18 1515  TempSrc: Oral  PainSc:    Pain Goal:                   Chai Verdejo L [name]

## 2018-11-10 NOTE — Anesthesia Preprocedure Evaluation (Signed)
Anesthesia Evaluation  Patient identified by MRN, date of birth, ID band Patient awake    Reviewed: Allergy & Precautions, NPO status , Patient's Chart, lab work & pertinent test results  Airway Mallampati: II  TM Distance: >3 FB Neck ROM: Full    Dental no notable dental hx. (+) Teeth Intact, Dental Advisory Given   Pulmonary neg pulmonary ROS,    Pulmonary exam normal breath sounds clear to auscultation       Cardiovascular negative cardio ROS Normal cardiovascular exam Rhythm:Regular Rate:Normal     Neuro/Psych PSYCHIATRIC DISORDERS Anxiety negative neurological ROS     GI/Hepatic negative GI ROS, Neg liver ROS,   Endo/Other  negative endocrine ROS  Renal/GU negative Renal ROS  negative genitourinary   Musculoskeletal negative musculoskeletal ROS (+)   Abdominal   Peds  Hematology negative hematology ROS (+)   Anesthesia Other Findings C/S for placenta previa  Reproductive/Obstetrics (+) Pregnancy                             Anesthesia Physical Anesthesia Plan  ASA: II  Anesthesia Plan: Spinal   Post-op Pain Management:    Induction:   PONV Risk Score and Plan: Treatment may vary due to age or medical condition  Airway Management Planned: Natural Airway  Additional Equipment:   Intra-op Plan:   Post-operative Plan:   Informed Consent: I have reviewed the patients History and Physical, chart, labs and discussed the procedure including the risks, benefits and alternatives for the proposed anesthesia with the patient or authorized representative who has indicated his/her understanding and acceptance.     Dental advisory given  Plan Discussed with: CRNA  Anesthesia Plan Comments:         Anesthesia Quick Evaluation

## 2018-11-10 NOTE — Anesthesia Procedure Notes (Signed)
Spinal  Patient location during procedure: OR Start time: 11/10/2018 12:21 PM End time: 11/10/2018 12:31 PM Staffing Anesthesiologist: Elmer Picker, MD Performed: anesthesiologist  Preanesthetic Checklist Completed: patient identified, surgical consent, pre-op evaluation, timeout performed, IV checked, risks and benefits discussed and monitors and equipment checked Spinal Block Patient position: sitting Prep: site prepped and draped and DuraPrep Patient monitoring: cardiac monitor, continuous pulse ox and blood pressure Approach: midline Location: L3-4 Injection technique: single-shot Needle Needle type: Pencan  Needle gauge: 24 G Needle length: 9 cm Assessment Sensory level: T6 Additional Notes Functioning IV was confirmed and monitors were applied. Sterile prep and drape, including hand hygiene and sterile gloves were used. The patient was positioned and the spine was prepped. The skin was anesthetized with lidocaine.  Free flow of clear CSF was obtained prior to injecting local anesthetic into the CSF.  The spinal needle aspirated freely following injection.  The needle was carefully withdrawn.  The patient tolerated the procedure well.

## 2018-11-10 NOTE — Lactation Note (Addendum)
This note was copied from a baby's chart. Lactation Consultation Note  Patient Name: Heidi Fritz [id number] Date: 11/10/2018 Reason for consult: Initial assessment;Primapara;1st time breastfeeding;Fritz < 6lbs;Early term 11-38.6wks  Visited with Heidi Fritz weighing 4 lbs 12oz.  Baby 5 hrs old.  CBG 71.  Baby has been STS on Mom's chest, but hasn't rooted of assisted to breastfeed yet.  Baby has had a couple borderline temps.  Assisted with breast massage and hand expression.  Baby spoon fed 2 ml of colostrum.  Mom getting uncomfortable with hand expression, and baby started cueing she was hungry.  Assisted with positioning baby in laid back position, to entice baby to open her mouth.  Mom has large, heavy breasts, with flat nipples.  Repositioned baby to football hold on right breast.  Tried pre-pumping to help evert nipple.  Breast shells given with instructions on use between feedings.  Initiated a 20 mm nipple shield, showing Mom how to correctly apply this.  Baby latched with assistance, sandwiching breast and tugging on chin to open mouth wider.  Baby stayed on and sucked rhythmically, hearing swallows intermittently.  Mom taught to compress breast firmly during sucking.  When baby came off after 25 mins, nipple shield filled with colostrum.    Assisted Mom to pump both breasts on initiation setting.  Mom collecting colostrum from right breast.    Reviewed normal newborn feeding behavior of baby <5 lbs.  Mom aware of sleepiness at the breast, need to awaken for feedings to enable >8 feedings per 24 hrs.    Mom very motivated to breastfeed, and wants to use EBM rather than formula if possible.  Reassured Mom that formula use would be temporary bridge to exclusive breastfeeding.   Lactation brochure left in room.  Plan- 1-Keep baby STS as much as possible 2- Encourage feeding baby on cue, or at least 8 times per 24 hrs. 3-offer breast using 20 mm nipple shield (pre-pump  first prn) 4-Limit baby to 30 mins at the breast 5- Pump both breasts 15 mins on initiation setting 6- feed baby any EBM expressed, adding formula as needed to meet volume supplementation guidelines.    Supplementation guidelines- 1- 1st 24 hrs-5-10 ml EBM+/formula by feeding tube at breast or slow flow paced bottle 2-2nd day- 10-20 ml EBM+/formula  10-11-28 ml on day 3.  Mom aware of IP and OP lactation support available to her.   Encouraged Mom to ask for help prn. RN updated on feeding plan. Asked LC Robyn to check on Mom at 9pm.  Maternal Data Formula Feeding for Exclusion: No Has patient been taught Hand Expression?: Yes Does the patient have breastfeeding experience prior to this delivery?: No  Feeding Feeding Type: Breast Fed  LATCH Score Latch: Grasps breast easily, tongue down, lips flanged, rhythmical sucking.  Audible Swallowing: A few with stimulation  Type of Nipple: Flat  Comfort (Breast/Nipple): Soft / non-tender  Hold (Positioning): Assistance needed to correctly position Fritz at breast and maintain latch.  LATCH Score: 7  Interventions Interventions: Breast feeding basics reviewed;Assisted with latch;Skin to skin;Breast massage;Hand express;Pre-pump if needed;Breast compression;Adjust position;Support pillows;Position options;Expressed milk;Shells;Hand pump;DEBP  Lactation Tools Discussed/Used Tools: Shells;Pump;Nipple Shields Nipple shield size: 20 Shell Type: Inverted Breast pump type: Double-Electric Breast Pump Pump Review: Setup, frequency, and cleaning;Milk Storage Initiated by:: Erby Pian RN IBCLC Date initiated:: 11/10/18   Consult Status Consult Status: Follow-up Date: 11/11/18 Follow-up type: In-patient    Heidi Fritz 11/10/2018, 6:17 PM

## 2018-11-10 NOTE — Discharge Summary (Signed)
Obstetrics Discharge Summary [facility]   Patient Name: Heidi Fritz DOB: 10/20/1990 MRN: [id number]  Date of admission: 11/10/2018 Delivering MD: Hermina StaggersERVIN, MICHAEL L   Date of discharge: 11/13/2018  Admitting diagnosis: Previa Intrauterine pregnancy: 1364w0d     Secondary diagnosis:   Active Problems:   Placenta previa   Status post cesarean section   Cesarean delivery delivered    Discharge diagnosis: Term Pregnancy Delivered                                            Postpartum procedures: None  Complicationsnone   [facility]  Hospital course: Heidi Fritz is a 28 y.o. 3064w0d who was admitted for scheduled primary LTCS for placenta previa. Her pregnancy was complicated by same. Delivery was uncomplicated. Please see delivery/op note for additional details. Her postpartum course was uncomplicated. She was breastfeeding and bottle feeding without difficulty. By day of discharge, she was passing flatus, urinating, eating and drinking without difficulty. Her pain was well-controlled, and she was discharged home. She will follow-up in clinic in 2 weeks for incision check and 4-6 weeks for PP visit.   Physical exam  Vitals:   11/12/18 0605 11/12/18 1629 11/12/18 2154 11/13/18 0550  BP: 127/77 122/73 125/72 119/62  Pulse: 90 88 87 75  Resp: 18 19 18 17   Temp: 98 F (36.7 C) 98.5 F (36.9 C) 98.3 F (36.8 C) 98 F (36.7 C)  TempSrc: Oral Oral Oral Oral  SpO2: 99% 100%  99%  Weight:      Height:       General: doing well, no complaints.  Lochia: appropriate Uterine Fundus: firm Incision: Dressing is clean, dry, and intact DVT Evaluation: No evidence of DVT seen on physical exam. Labs: Lab Results  Component Value Date   WBC 18.4 (H) 11/11/2018   HGB 11.0 (L) 11/11/2018   HCT 32.9 (L) 11/11/2018   MCV 92.2 11/11/2018   PLT 269 11/11/2018   CMP Latest Ref Rng & Units 11/10/2018  Glucose 65 - 99 mg/dL -  BUN 6 - 20 mg/dL -  Creatinine 4.090.44  - 8.111.00 mg/dL 9.140.58  Sodium 782135 - 956145 mmol/L -  Potassium 3.5 - 5.1 mmol/L -  Chloride 101 - 111 mmol/L -    Discharge instructions: Per After Visit Summary and "Baby and Me Booklet"  After visit meds:  Allergies as of 11/13/2018      Reactions   Codeine Nausea Only      Medication List    TAKE these medications   PRENATAL VITAMINS PO Take 1 tablet by mouth daily.       Postpartum contraception: Does not want Depo; is considering IUD.  Diet: Routine Diet Activity: Advance as tolerated. Pelvic rest for 6 weeks.   Follow-up Appt: Future Appointments  Date Time Provider Department Center  11/27/2018 10:30 AM Hermina StaggersErvin, Michael L, MD [facility] None   Follow-up Visit:No follow-ups on file. Please schedule this patient for Postpartum visit in: 4 weeks with the following provider: Any provider For C/S patients schedule nurse incision check in weeks 2 weeks: yes High risk pregnancy complicated by: placenta previa Delivery mode:  CS Anticipated Birth Control:  other/unsure PP Procedures needed: Incision check  Schedule Integrated BH visit: no  Newborn Data: Live born female  Birth Weight:   APGAR: 7, 8  Newborn Delivery   Birth  date/time:  11/10/2018 12:54:00 Delivery type:  C-Section, Low Transverse Trial of labor:  No C-section categorization:  Primary     Baby Feeding: Bottle and Breast Disposition:home with mother  Luna Kitchens CNM

## 2018-11-11 LAB — CBC
HCT: 32.9 % — ABNORMAL LOW (ref 36.0–46.0)
Hemoglobin: 11 g/dL — ABNORMAL LOW (ref 12.0–15.0)
MCH: 30.8 pg (ref 26.0–34.0)
MCHC: 33.4 g/dL (ref 30.0–36.0)
MCV: 92.2 fL (ref 80.0–100.0)
Platelets: 269 10*3/uL (ref 150–400)
RBC: 3.57 MIL/uL — ABNORMAL LOW (ref 3.87–5.11)
RDW: 12.6 % (ref 11.5–15.5)
WBC: 18.4 10*3/uL — ABNORMAL HIGH (ref 4.0–10.5)
nRBC: 0 % (ref 0.0–0.2)

## 2018-11-11 LAB — ABO/RH: ABO/RH(D): A POS

## 2018-11-11 LAB — RPR: RPR Ser Ql: NONREACTIVE

## 2018-11-11 NOTE — Progress Notes (Signed)
Subjective: Postpartum Day 1: Cesarean Delivery Patient reports no complaints. States pain is well-controlled. Has been up to bathroom without difficulty. Wondering about wearing an abdominal binder.   Objective: Vital signs in last 24 hours: Temp:  [97.5 F (36.4 C)-98.7 F (37.1 C)] 97.8 F (36.6 C) (04/19 0828) Pulse Rate:  [63-83] 72 (04/19 0828) Resp:  [12-18] 16 (04/19 0828) BP: (114-141)/(56-91) 114/56 (04/19 0828) SpO2:  [97 %-100 %] 98 % (04/19 0828) Weight:  [69.9 kg] 69.9 kg (04/18 1030)  Physical Exam:  General: alert, well-appearing, NAD Lochia: appropriate Uterine Fundus: firm Incision: honeycomb dressing in place - C/D/I aside from small area on right side of dried blood but not extending outside of margins DVT Evaluation: No significant calf/ankle edema  Recent Labs    11/10/18 1100 11/11/18 0450  HGB 13.2 11.0*  HCT 39.9 32.9*    Assessment/Plan: Status post Cesarean section. Doing well postoperatively.  Continue current care. HgB stable Plan to discharge home tomorrow  Tamera Stands, DO 11/11/2018, 10:24 AM

## 2018-11-11 NOTE — Lactation Note (Addendum)
This note was copied from a baby's chart. Lactation Consultation Note  Patient Name: Heidi Fritz [id number] Date: 11/11/2018 Reason for consult: Follow-up assessment;Difficult latch;1st time breastfeeding;Early term 37-38.6wks P1, 12 hour female infant, ETI and less than 5 lbs at birth. Mom had used DEBP and pumped 8 ml of colostrum. Infant was cuing to breastfeed.Mom has very flat nipples and using 20 mm NS. LC asked mom pre-pump with cylinder hand pump and apply 20 mm NS that was pre-filled with 3 ml of colostrum using a curve tip syringe. Mom latched infant on left breast using the football hold, infant sustained latch, swallows heard, NS was re-filled and infant took a total of 8 ml of colostrum at breast.  Infant continued to breastfeed after NS was empty of pre-filled colostrum, infant breastfeed in a rthymitic suckling pattern for 14 minutes.  Infant appeared content after feeding. Mom used DEBP afterward to have EBM to supplement for next feeding. Mom prefers to supplement with as much as possible with  her breast milk instead of using formula. Mom has the feeding guidelines for breastfeeding and supplementing with EBM/ or formula based on infant's age/ hours of life. Mom Knows to ask Nurse or LC  if she has questions, concerns or needs assistance with latching infant to breast.   Maternal Data    Feeding Feeding Type: Breast Fed  LATCH Score Latch: Grasps breast easily, tongue down, lips flanged, rhythmical sucking.  Audible Swallowing: Spontaneous and intermittent  Type of Nipple: Flat  Comfort (Breast/Nipple): Soft / non-tender  Hold (Positioning): Assistance needed to correctly position infant at breast and maintain latch.  LATCH Score: 8  Interventions Interventions: Assisted with latch  Lactation Tools Discussed/Used Nipple shield size: 20   Consult Status Consult Status: Follow-up Date: 11/11/18 Follow-up type: In-patient    Danelle Earthly 11/11/2018, 1:51 AM

## 2018-11-11 NOTE — Progress Notes (Signed)
CSW received consult for hx of marijuana use.  Referral was screened out due to the following: ~MOB had no documented substance use after initial prenatal visit/+UPT. ~MOB had no positive drug screens after initial prenatal visit/+UPT. ~Baby's UDS is negative.  Please consult CSW if current concerns arise or by MOB's request.  CSW will monitor CDS results and make report to Child Protective Services if warranted.  MOB was referred for history of depression/anxiety. * Referral screened out by Clinical Social Worker because none of the following criteria appear to apply: ~ History of anxiety/depression during this pregnancy, or of post-partum depression following prior delivery. ~ Diagnosis of anxiety and/or depression within last 3 years OR * MOB's symptoms currently being treated with medication and/or therapy. Please contact the Clinical Social Worker if needs arise, by MOB request, or if MOB scores greater than 9/yes to question 10 on Edinburgh Postpartum Depression Screen.  Heidi Fritz, MSW, LCSW Clinical Social Work [phone]  

## 2018-11-12 NOTE — Lactation Note (Signed)
This note was copied from a baby's chart. Lactation Consultation Note  Patient Name: Heidi Fritz Date: 11/12/2018 Reason for consult: Follow-up assessment;1st time breastfeeding;Primapara;Early term 37-38.6wks;Infant < 6lbs  Visited with [name] of ET <5 lb infant at 26 hrs old.  Baby at 4% weight loss.  Baby has had some low temps causing Mom to not want to unswaddle baby from blankets, nor does she want to breastfeed baby to avoid her using more energy.  Mom has been exclusively bottle feeding baby, and pumping.    Mom misplaced one of the breast pump parts (white disc) that is needed for suction, over 24 hrs ago.  Mom has been pumping one breast at a time, and is discouraged about not getting more than a few drops.   Replaced the white disc, and encouraged Mom to double pump with explanation of benefits to her milk supply.  Encouraged increasing frequency today to every 1 1/2-2 hrs for a few rounds of pumping, and then can return to every 3 hrs.   Mom has a Spectra DEBP at home.    Instructed parents on paced bottle feeding.  Baby has been regurgitating the formula.  Also, with double pumping, more volume can be expressed for baby which should be tolerated better.  Encouraged STS as much as possible, lots of breast massage and hand expression.  Encouraged volume today to be 20-30 ml.  Last feeding baby took 28 ml of 22 cal formula.   Praised Mom for her hard work.  Parents understand importance of good pump parts washing, rinsing, and air drying each time.  Offered to assist baby at the breast if she would like to try again.  Mom smiled and nodded.  To let her nurse know that she would like assistance.     Interventions Interventions: Breast feeding basics reviewed;Skin to skin;Breast massage;Hand express;DEBP  Lactation Tools Discussed/Used Tools: Shells;Pump;Nipple Dorris Carnes;Bottle Nipple shield size: 20 Shell Type: Inverted Breast pump type: Double-Electric Breast  Pump   Consult Status Consult Status: Follow-up Date: 11/13/18 Follow-up type: In-patient    Heidi Fritz 11/12/2018, 9:48 AM

## 2018-11-12 NOTE — Progress Notes (Signed)
Subjective: Postpartum Day 2: Primary Cesarean Delivery for Placenta Previa Patient reports tolerating PO, + flatus and no problems voiding.    Objective: Vital signs in last 24 hours: Temp:  [98 F (36.7 C)-98.3 F (36.8 C)] 98 F (36.7 C) (04/20 0605) Pulse Rate:  [73-90] 90 (04/20 0605) Resp:  [17-18] 18 (04/20 0605) BP: (114-127)/(66-77) 127/77 (04/20 0605) SpO2:  [99 %] 99 % (04/20 2751)  Physical Exam:  General: alert, cooperative, appears stated age and no distress Lochia: appropriate Uterine Fundus: firm Incision: no significant drainage DVT Evaluation: No evidence of DVT seen on physical exam. Negative Homan's sign.  Recent Labs    11/10/18 1100 11/11/18 0450  HGB 13.2 11.0*  HCT 39.9 32.9*    Assessment/Plan: Status post Cesarean section. Doing well postoperatively.  Pumping and bottle feeding, feeling confident about her ability to care for her newborn Continue current care Discharge tomorrow  Calvert Cantor, CNM 11/12/2018, 10:24 AM

## 2018-11-13 ENCOUNTER — Telehealth: Payer: Self-pay | Admitting: *Deleted

## 2018-11-13 MED ORDER — MEDROXYPROGESTERONE ACETATE 150 MG/ML IM SUSP
150.0000 mg | Freq: Once | INTRAMUSCULAR | Status: AC
  Administered 2018-11-13: 13:00:00 150 mg via INTRAMUSCULAR
  Filled 2018-11-13: qty 1

## 2018-11-13 NOTE — Telephone Encounter (Signed)
Pt called to office with questions about her c/s dressing. Pt made aware that she may remove dressing after 1 week. Pt advised to keep area clean and dry. Advised to call office if any concerns once she takes drsg off.

## 2018-11-13 NOTE — Lactation Note (Signed)
This note was copied from a baby's chart. Lactation Consultation Note  Patient Name: Heidi Fritz [id number] Date: 11/13/2018    Baby 66 hours old and mother is formula feeding and pumping q 2.5-3 hours. Mother was able to express 20 ml her first pumping session and has only had drops since then per mother. Discussed hand expression before and after and hands on pumping.  Referred her to video to watch to increase her milk supply.   Mother states she is double pumping.  Reminded her to sit upright when pumping and occasionally lean forward.  Reviewed engorgement care and monitoring voids/stools. Mother has personal DEBP at home.  Encouraged her to call if she has further questions.    Maternal Data    Feeding Feeding Type: Bottle Fed - Formula Nipple Type: Nfant Slow Flow (purple)  LATCH Score                   Interventions    Lactation Tools Discussed/Used     Consult Status      Hardie Pulley 11/13/2018, 7:45 AM

## 2018-11-14 ENCOUNTER — Telehealth: Payer: Self-pay | Admitting: *Deleted

## 2018-11-14 NOTE — Telephone Encounter (Signed)
Pt called to office asking if she should have had any Rx when she left hospital.  Reviewed chart, no Rx's sent to pharmacy.  States pain well controlled inpatient.  attempt to return call. LM on VM making pt aware of no Rx sent in. Advised that she may take OTC Ibuprofen and Tylenol as needed for pain.  Pt advised to call office with any other concerns.

## 2018-11-16 ENCOUNTER — Telehealth: Payer: Self-pay | Admitting: *Deleted

## 2018-11-16 NOTE — Telephone Encounter (Signed)
Pt called to office with questions regarding c/s dressing.  Spoke with pt.  She states some drainage noted on dressing. Pt made aware some drainage normal, should not be large amount. Pt states she is feeling well and minimal pain associated with incision. Pt advised she may take off honeycomb 1 week after delivery. Pt made aware if any increase in drainge, bleeding from site she needs to be seen. Pt advised to keep area clean and dry.  Follow up in office as scheduled or call if any concerns.  Pt states understanding.

## 2018-11-20 ENCOUNTER — Telehealth: Payer: Self-pay | Admitting: *Deleted

## 2018-11-20 NOTE — Telephone Encounter (Signed)
Pt called to office stating she removed her strips when she took off c/s dressing. Attempt to return call.  LM making pt aware she should be fine as she states incision looks to be healing fine.  Advised if any signs of problems to call office.

## 2018-11-27 ENCOUNTER — Ambulatory Visit (INDEPENDENT_AMBULATORY_CARE_PROVIDER_SITE_OTHER): Payer: 59 | Admitting: Obstetrics and Gynecology

## 2018-11-27 ENCOUNTER — Encounter: Payer: Self-pay | Admitting: Obstetrics and Gynecology

## 2018-11-27 ENCOUNTER — Other Ambulatory Visit: Payer: Self-pay

## 2018-11-27 VITALS — BP 126/78 | HR 77 | Ht 62.0 in | Wt 141.0 lb

## 2018-11-27 DIAGNOSIS — Z98891 History of uterine scar from previous surgery: Secondary | ICD-10-CM

## 2018-11-27 NOTE — Patient Instructions (Signed)

## 2018-11-27 NOTE — Progress Notes (Signed)
Heidi Fritz is here for incision check S/P LTCS on 11/10/18 d/t placenta previa Post op course was unremarkable D/C home on POD # 3. She has no complaints today. Bleeding has stopped. No bowel or bladder dysfunction Tolerating diet. Pain controlled Bottle feeding Received Depo Provera prior to discharge home  PE AF VSS Lungs clear Heart RRR Abd soft + BS incision well healed  A/P S/P LTCS        Incision check Slowly return to nl ADL's. PP visit in 2 weeks

## 2018-11-27 NOTE — Progress Notes (Signed)
3 weeks PP Incision Check, reports no problems today.

## 2018-12-11 ENCOUNTER — Ambulatory Visit: Payer: 59 | Admitting: Obstetrics

## 2018-12-18 ENCOUNTER — Telehealth: Payer: Self-pay | Admitting: Obstetrics

## 2019-01-02 ENCOUNTER — Encounter: Payer: Self-pay | Admitting: Obstetrics

## 2019-01-02 ENCOUNTER — Ambulatory Visit (INDEPENDENT_AMBULATORY_CARE_PROVIDER_SITE_OTHER): Payer: 59 | Admitting: Obstetrics

## 2019-01-02 DIAGNOSIS — Z1389 Encounter for screening for other disorder: Secondary | ICD-10-CM | POA: Diagnosis not present

## 2019-01-02 DIAGNOSIS — N760 Acute vaginitis: Secondary | ICD-10-CM

## 2019-01-02 DIAGNOSIS — Z3009 Encounter for other general counseling and advice on contraception: Secondary | ICD-10-CM

## 2019-01-02 DIAGNOSIS — B9689 Other specified bacterial agents as the cause of diseases classified elsewhere: Secondary | ICD-10-CM

## 2019-01-02 MED ORDER — METRONIDAZOLE 500 MG PO TABS: 500.0000 mg | ORAL_TABLET | Freq: Two times a day (BID) | ORAL | 2 refills | Status: DC

## 2019-01-02 NOTE — Progress Notes (Signed)
    TELEHEALTH POSTPARTUM VIRTUAL VIDEO VISIT ENCOUNTER NOTE  Provider location: [facility] [place]   I connected with Heidi Fritz on 01/02/19 at 10:30 AM EDT by Televisit Encounter at home and verified that I am speaking with the correct person using two identifiers.    I discussed the limitations, risks, security and privacy concerns of performing an evaluation and management service by telephone and the availability of in person appointments. I also discussed with the patient that there may be a patient responsible charge related to this service. The patient expressed understanding and agreed to proceed.  Appointment Date: 01/02/2019  OBGYN Clinic: [facility]  Chief Complaint: Postpartum Visit  History of Present Illness: Heidi Fritz is a 28 y.o. Caucasian G1P1001 being evaluated for postpartum followup.    She is s/p primary cesarean section on 11/10/2018 at 37 weeks; she was discharged to home on 11-13-2018,D#3. Pregnancy complicated by Placenta Previa. Baby is doing well.  Complains of vaginal odor for 3 weeks,  Vaginal bleeding or discharge: No  Intercourse: No  Contraception: Depo-Provera Mode of feeding infant: Bottle PP depression s/s: No .  Any bowel or bladder issues: No  Pap smear: no abnormalities (date: 05/03/2018)  Review of Systems: Positive for vaginal discharge with odor. Her 12 point review of systems is otherwise negative or as noted in the History of Present Illness.  Patient Active Problem List   Diagnosis Date Noted  . Status post cesarean section 11/10/2018    Medications Heidi Fritz. Heidi Fritz had no medications administered during this visit. Current Outpatient Medications  Medication Sig Dispense Refill  . Prenatal Multivit-Min-Fe-FA (PRENATAL VITAMINS PO) Take 1 tablet by mouth daily.      No current facility-administered medications for this visit.     Allergies Codeine  Physical Exam:  There were no vitals taken for this  visit. Reviewed from Babyscripts General:  Alert, oriented and cooperative. Patient is in no acute distress.  Mental Status: Normal mood and affect. Normal behavior. Normal judgment and thought content.   Respiratory: Normal respiratory effort noted, no problems with respiration noted  Rest of physical exam deferred due to type of encounter  PP Depression Screening:    Assessment:Patient is a 28 y.o. G1P1001 who is 7 weeks postpartum from a primary cesarean section.  She is doing well.   Plan:  1. Postpartum care following cesarean delivery - doing well  2. Encounter for other general counseling and advice on contraception - undecided on contraception - contraceptive options discussed  3. BV (bacterial vaginosis) Rx: - metroNIDAZOLE (FLAGYL) 500 MG tablet; Take 1 tablet (500 mg total) by mouth 2 (two) times daily.  Dispense: 14 tablet; Refill: 2  RTC 4 months for annual  I discussed the assessment and treatment plan with the patient. The patient was provided an opportunity to ask questions and all were answered. The patient agreed with the plan and demonstrated an understanding of the instructions.   The patient was advised to call back or seek an in-person evaluation/go to the ED for any concerning postpartum symptoms.  I provided 10 minutes of face-to-face time during this encounter.   Courtney Heys, CMA / [place] [facility]. [place] for [employer], [facility] 01-02-2019

## 2020-11-11 ENCOUNTER — Ambulatory Visit: Payer: Self-pay | Admitting: Podiatrist

## 2020-12-27 IMAGING — US US MFM OB TRANSVAGINAL
1 series · 13 of 28 positions shown · non-contrast
Comparison: none

[Series 1: us mfm ob transvaginal · 13 of 63 slices shown]
[im 3/63]
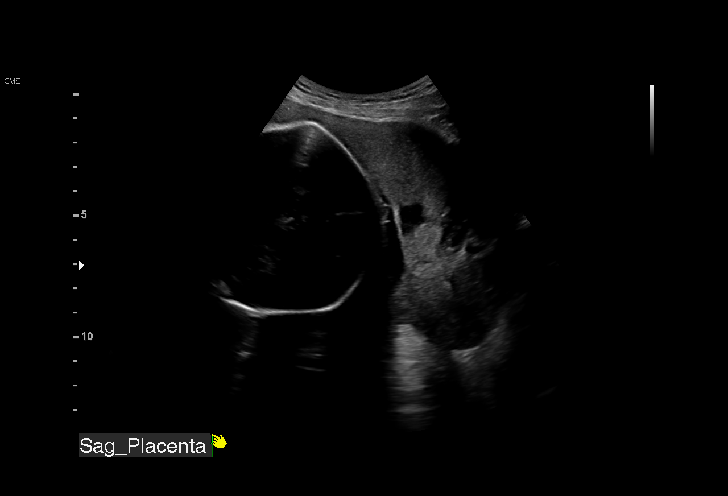
[im 7/63]
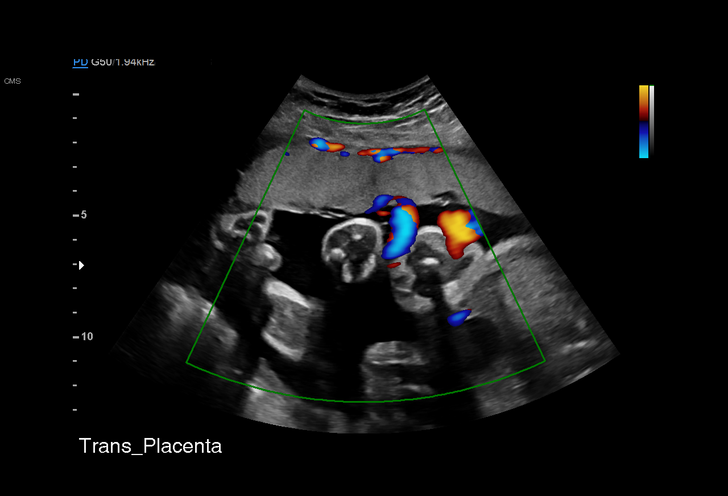
[im 12/63]
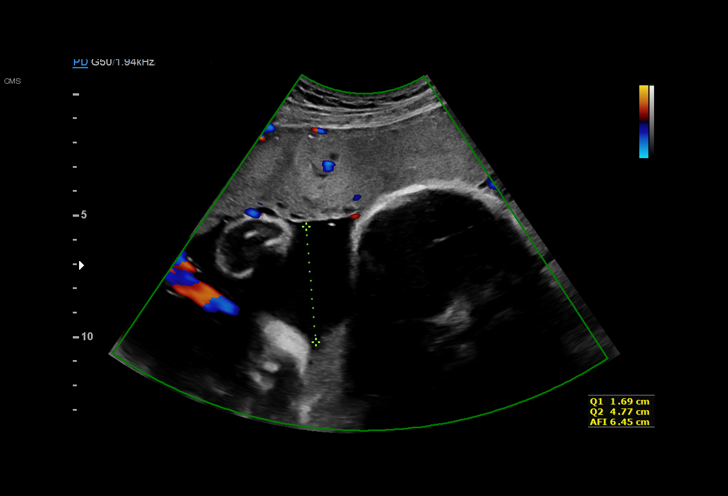
[im 17/63]
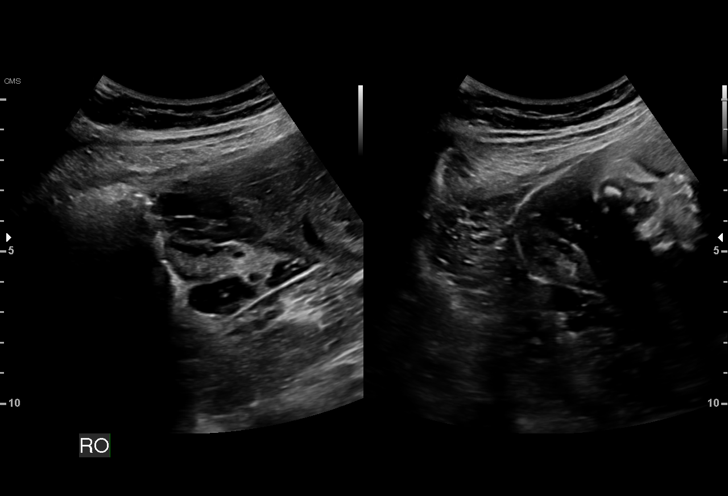
[im 21/63]
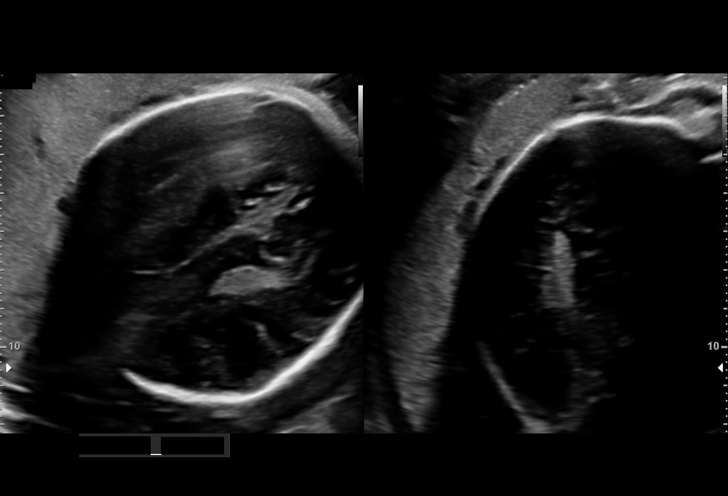
[im 26/63]
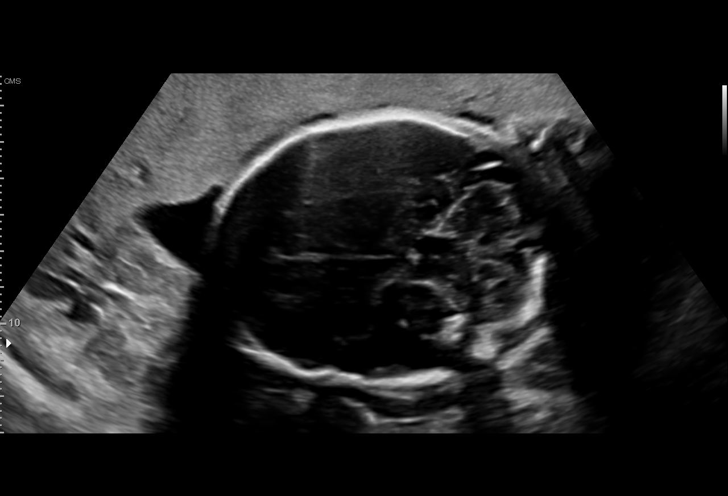
[im 33/63]
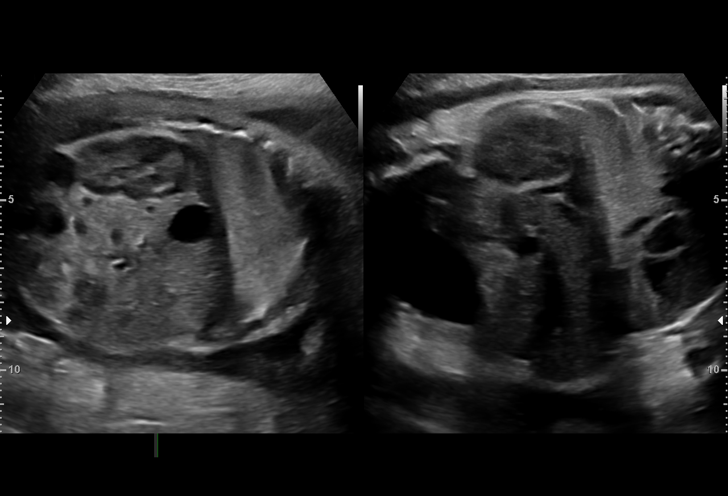
[im 37/63]
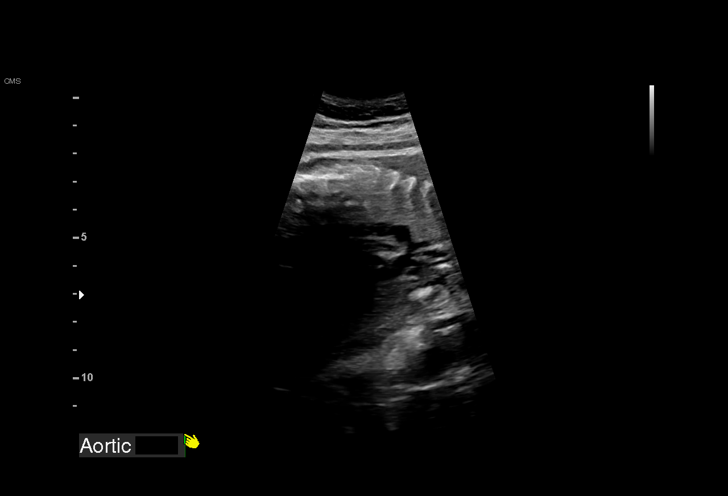
[im 42/63]
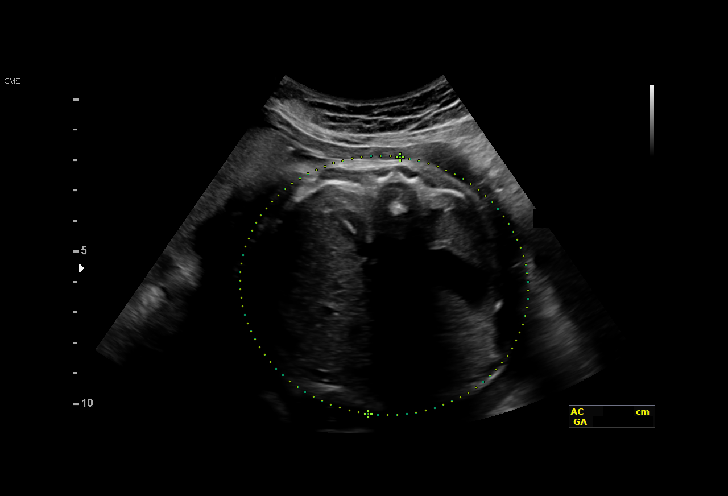
[im 46/63]
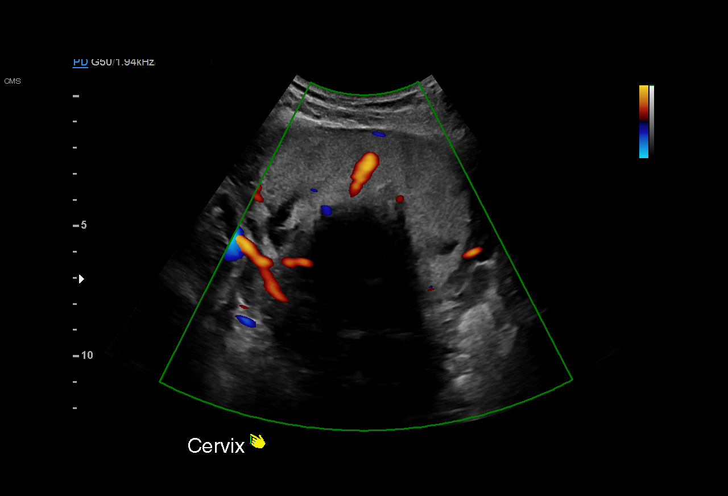
[im 51/63]
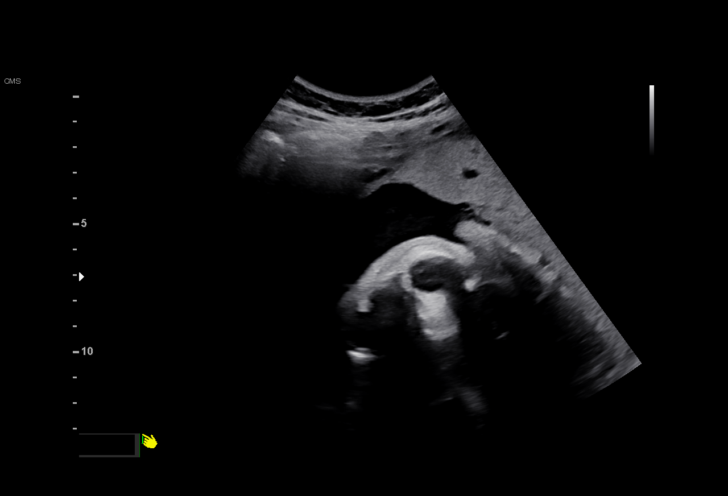
[im 56/63]
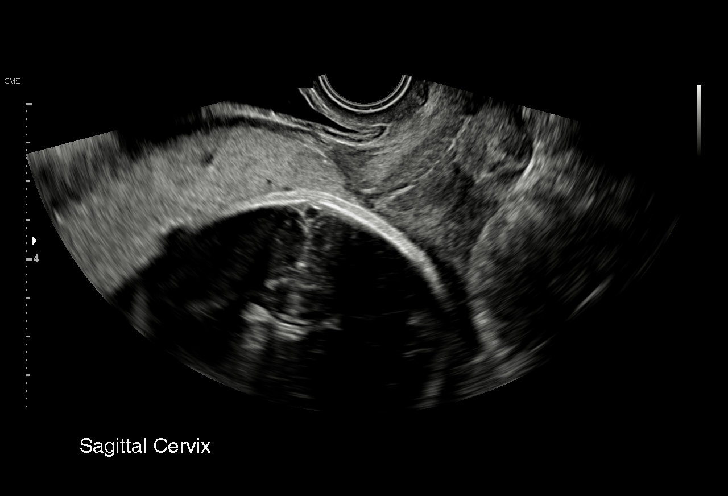
[im 60/63]
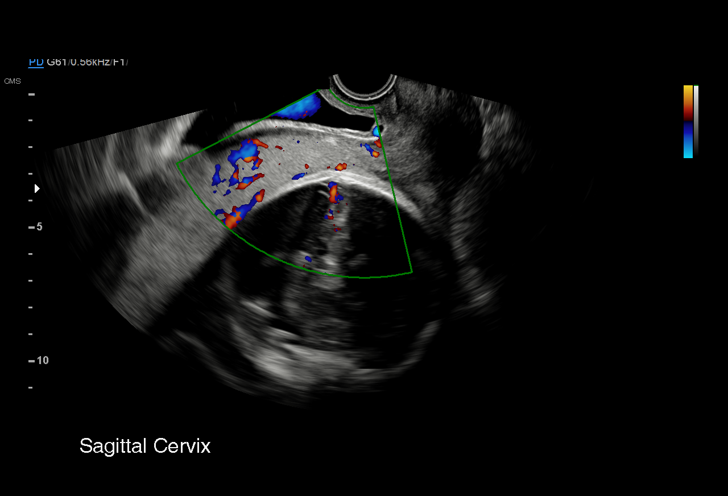

[13 of 28 positions shown; findings below may reference images not displayed]

1  US MFM OB TRANSVAGINAL               76817.2      NAMOZ IVANCHENKO
 ----------------------------------------------------------------------

 ----------------------------------------------------------------------
Indications

  Encounter for other antenatal screening
  follow-up
  Placenta previa specified as without
  hemorrhage, third trimester
  Encounter for cervical length
  Fetal abnormality - other known or
  suspected (EIF, nuchal fold)
  Cystic Fibrosis (CF) Carrier, third trimester
  32 weeks gestation of pregnancy
 ----------------------------------------------------------------------
Fetal Evaluation

 Num Of Fetuses:         1
 Fetal Heart Rate(bpm):  152
 Cardiac Activity:       Observed
 Presentation:           Cephalic
 Placenta:               Anterior previa
 P. Cord Insertion:      Visualized

 Amniotic Fluid
 AFI FV:      Within normal limits

 AFI Sum(cm)     %Tile       Largest Pocket(cm)
 16.3            59

 RUQ(cm)       RLQ(cm)       LUQ(cm)        LLQ(cm)

Biometry

 BPD:      76.6  mm     G. Age:  30w 5d          7  %    CI:        69.86   %    70 - 86
                                                         FL/HC:      20.1   %    19.1 -
 HC:      292.4  mm     G. Age:  32w 2d         15  %    HC/AC:      1.05        0.96 -
 AC:      278.3  mm     G. Age:  32w 0d         38  %    FL/BPD:     76.8   %    71 - 87
 FL:       58.8  mm     G. Age:  30w 5d          7  %    FL/AC:      21.1   %    20 - 24
 HUM:      55.7  mm     G. Age:  32w 3d         55  %

 Est. FW:    5885  gm    3 lb 14 oz      41  %
OB History

 Gravidity:    1
Gestational Age

 LMP:           32w 2d        Date:  02/24/18                 EDD:   12/01/18
 U/S Today:     31w 3d                                        EDD:   12/07/18
 Best:          32w 2d     Det. By:  LMP  (02/24/18)          EDD:   12/01/18
Anatomy

 Cranium:               Appears normal         LVOT:                   Appears normal
 Cavum:                 Appears normal         Aortic Arch:            Appears normal
 Ventricles:            Appears normal         Ductal Arch:            Previously seen
 Choroid Plexus:        Appears normal         Diaphragm:              Previously seen
 Cerebellum:            Appears normal         Stomach:                Appears normal, left
                                                                       sided
 Posterior Fossa:       Previously seen        Abdomen:                Appears normal
 Nuchal Fold:           Previously             Abdominal Wall:         Previously seen
                        appeared enlarged
 Face:                  Orbits and profile     Cord Vessels:           Previously seen
                        previously seen
 Lips:                  Appears normal         Kidneys:                Appear normal
 Palate:                Not well visualized    Bladder:                Appears normal
 Thoracic:              Appears normal         Spine:                  Previously seen
 Heart:                 Echogenic focus        Upper Extremities:      Previously seen
                        in LV prev vis
 RVOT:                  Previously seen        Lower Extremities:      Previously seen
Cervix Uterus Adnexa

 Cervix
 Length:           4.35  cm.
 Normal appearance by transvaginal scan

 Uterus
 No abnormality visualized.
Impression

 Patient returned for evaluation of placenta. She does not give
 history of vaginal bleeding.
 Fetal growth is appropriate for gestational age. Amniotic fluid
 is normal and good fetal activity is seen. We performed
 transvaginal scan to evaluate the placenta. Placenta previa is
 seen with placental edge reaching internal os. There is no
 evidence of placenta accreta.
 The cervix measures 4.4 cm, which is normal.
 I explained the findings and that there is only a small chance
 of resolution of placenta previa with advancing gestation.
 I recommend a follow-up transvaginal evaluation in 3 weeks.
 Patient was counseled that she would undergo cesarean
 delivery at 37 weeks' gestation if placenta previa persists.
 I advised her to practice abstinence from sexual intercourse.
Recommendations

 An appointment was made for her to return in 4 weeks for
 transvaginal ultrasound to evaluate the placenta.
                      Jumper, Klever

## 2021-01-20 ENCOUNTER — Other Ambulatory Visit: Payer: Self-pay

## 2021-01-20 ENCOUNTER — Ambulatory Visit (INDEPENDENT_AMBULATORY_CARE_PROVIDER_SITE_OTHER): Payer: Self-pay | Admitting: Podiatry

## 2021-01-20 DIAGNOSIS — M79675 Pain in left toe(s): Secondary | ICD-10-CM

## 2021-01-20 DIAGNOSIS — L603 Nail dystrophy: Secondary | ICD-10-CM

## 2021-01-20 NOTE — Progress Notes (Signed)
   HPI: 30 y.o. female presenting today as a new patient for second opinion regarding discoloration and thickening to the left hallux nail plate.  Patient states that she has had some discoloration and thickening to the left great toe for several months now.  She was seen by different podiatrist about 3 weeks ago at [facility] and she was informed it was fungal.  Prescription for Lamisil x90 days was sent to the pharmacy.  She was concerned to take the antifungal pill and wanted a second opinion.  She presents for further treatment and evaluation  Past Medical History:  Diagnosis Date   Anxiety      Physical Exam: General: The patient is alert and oriented x3 in no acute distress.  Dermatology: Skin is warm, dry and supple bilateral lower extremities. Negative for open lesions or macerations. It appears that an old nail plate is lying on top of the new growth of a new nail plate.  There is a transverse line at the base of the nail and the new nail growth is visible underneath the old nail plate.  There is some slight discoloration.  This layered nail growth pattern does make the nail plate thicker.  There is also associated tenderness to palpation  Vascular: Palpable pedal pulses bilaterally. No edema or erythema noted. Capillary refill within normal limits.  Neurological: Epicritic and protective threshold grossly intact bilaterally.   Musculoskeletal Exam: No pedal deformities noted   Aassessment: 1.  New nail growth underlying old nail plate left hallux   Plan of Care:  1. Patient evaluated.  2.  Today explained to the patient that the discoloration of the toenail is not associated to fungal growth.  This is likely secondary to a history of slight trauma to the area.  I explained to the patient her options are to allow the nail to grow out completely or remove the old nail growth that is overlying the new nail growth.  The patient opts to have the old nail growth removed.  The  toe was prepped in aseptic manner and prior to avulsion of the old nail plate, digital block was performed using 2% lidocaine plain totaling 3 mL. 3.  The overlying old nail growth was completely avulsed.  Light dressing applied and posterior instructions provided 4.  Return to clinic as needed      Felecia Shelling, DPM [facility]  Dr. Felecia Shelling, DPM    [address] [place], [place] [address]                Office [phone]  Fax [phone]

## 2021-10-04 ENCOUNTER — Other Ambulatory Visit: Payer: Self-pay

## 2021-10-04 ENCOUNTER — Ambulatory Visit (INDEPENDENT_AMBULATORY_CARE_PROVIDER_SITE_OTHER): Payer: Self-pay | Admitting: Podiatry

## 2021-10-04 DIAGNOSIS — L603 Nail dystrophy: Secondary | ICD-10-CM

## 2021-10-04 NOTE — Progress Notes (Signed)
? ?  HPI: 31 y.o. female presenting today for follow-up evaluation of a dystrophic nail to the left hallux nail plate.  Patient states that after she was seen this past summer the nail began to begin yellow and thick and discolored.  She believes that an old nail is growing on top of new nail growth again similar to the same presentation last time.  She presents for further treatment and evaluation ? ?Past Medical History:  ?Diagnosis Date  ? Anxiety   ? ?  ?Physical Exam: ?General: The patient is alert and oriented x3 in no acute distress. ? ?Dermatology: Skin is warm, dry and supple bilateral lower extremities. Negative for open lesions or macerations. ?It appears that an old nail plate is lying on top of the new growth of a new nail plate.  There is a transverse line at the base of the nail and the new nail growth is visible underneath the old nail plate.  There is some slight discoloration.  This layered nail growth pattern does make the nail plate thicker.  There is also associated tenderness to palpation ? ?Vascular: Palpable pedal pulses bilaterally. No edema or erythema noted. Capillary refill within normal limits. ? ?Neurological: Epicritic and protective threshold grossly intact bilaterally.  ? ?Musculoskeletal Exam: No pedal deformities noted  ? ?Aassessment: ?1.  New nail growth underlying old nail plate left hallux ? ? ?Plan of Care:  ?1. Patient evaluated.  ?2.  Today we discussed different treatment options both conservative and more aggressive.  After discussing with the patient I did recommend possible total temporary nail avulsion of the hallux nail plate and as the new nail begins to grow and applying a topical rejuvenation lacquer.  Patient agrees with this plan. ?3.  Prior to the procedure the toe was prepped in aseptic manner and digital block performed using 3 mL of 2% lidocaine plain ?4.  The nail was avulsed in its entirety and light dressing applied.  Post care instructions provided ?5.   Topical Tolcylen antifungal nail solution was provided for the patient to apply daily when the nail begins to grow and ?6.  Return to clinic as needed ?  ?  ?Felecia Shelling, DPM ?[facility] ? ?Dr. Felecia Shelling, DPM  ?  ?[address]                                        ?[place], [place] [address]                ?Office [phone]  ?Fax [phone] ? ? ? ? ?

## 2022-02-02 ENCOUNTER — Encounter: Payer: Self-pay | Admitting: Gastroenterology

## 2022-03-08 ENCOUNTER — Ambulatory Visit: Payer: 59 | Admitting: Gastroenterology

## 2022-09-19 ENCOUNTER — Telehealth: Payer: Self-pay | Admitting: Podiatry

## 2022-09-19 NOTE — Telephone Encounter (Signed)
Pt called and r/s appt from 2.28 to 3.27.2024

## 2022-09-21 ENCOUNTER — Ambulatory Visit: Payer: Self-pay | Admitting: Podiatry

## 2022-10-19 ENCOUNTER — Ambulatory Visit (INDEPENDENT_AMBULATORY_CARE_PROVIDER_SITE_OTHER): Payer: Self-pay | Admitting: Podiatry

## 2022-10-19 DIAGNOSIS — B351 Tinea unguium: Secondary | ICD-10-CM

## 2022-10-19 DIAGNOSIS — L603 Nail dystrophy: Secondary | ICD-10-CM

## 2022-10-19 MED ORDER — TERBINAFINE HCL 250 MG PO TABS: 250.0000 mg | ORAL_TABLET | Freq: Every day | ORAL | 0 refills | Status: DC

## 2022-10-19 NOTE — Progress Notes (Signed)
Chief Complaint  Patient presents with   Nail Problem    Patient came in today for left hallux nail dystrophic, patient would like to remove the nail today, and start Lamisil, patient denies any pain      Subjective: 32 y.o. female presenting today for follow-up evaluation of thickening with discoloration to the left hallux nail plate.  Patient was last seen approximately 1 year ago at that time total temporary nail avulsion was performed and she began applying OTC Tolcylen antifungal topical as the nail grew out.  He continues to be thick and discolored.  She is considering oral antifungal medication today.  She is also requesting to have the nail avulsed in its entirety again to allow the new nail to grow in.  Presenting for further treatment and evaluation  Past Medical History:  Diagnosis Date   Anxiety     Past Surgical History:  Procedure Laterality Date   CESAREAN SECTION N/A 11/10/2018   Procedure: CESAREAN SECTION;  Surgeon: Chancy Milroy, MD;  Location: [facility];  Service: Obstetrics;  Laterality: N/A;   I & D EXTREMITY Left 02/19/2016   Procedure: IRRIGATION AND DEBRIDEMENT  AND REPAIR AS NECESSARY ARM , WRIST;  Surgeon: Roseanne Kaufman, MD;  Location: [place];  Service: Orthopedics;  Laterality: Left;   NERVE REPAIR Left 02/19/2016   Procedure: NERVE REPAIR;  Surgeon: Roseanne Kaufman, MD;  Location: [place];  Service: Orthopedics;  Laterality: Left;   TENDON REPAIR Left 02/19/2016   Procedure: TENDON REPAIR;  Surgeon: Roseanne Kaufman, MD;  Location: [place];  Service: Orthopedics;  Laterality: Left;    Allergies  Allergen Reactions   Codeine Nausea Only   Other     Objective: Physical Exam General: The patient is alert and oriented x3 in no acute distress.  Dermatology: Hyperkeratotic, discolored, thickened, onychodystrophy noted to the left hallux nail plate. Skin is warm, dry and supple bilateral lower extremities. Negative for open lesions or macerations.  Vascular:  Palpable pedal pulses bilaterally. No edema or erythema noted. Capillary refill within normal limits.  Neurological: Epicritic and protective threshold grossly intact bilaterally.   Musculoskeletal Exam: No pedal deformity noted  Assessment: #1 Onychomycosis of toenail left hallux nail plate  Plan of Care:  #1 Patient was evaluated. #2  Today we discussed different treatment options including oral, topical, and laser antifungal treatment modalities.  We discussed their efficacies and side effects.  Patient opts for oral antifungal treatment modality #3 prescription for Lamisil 250 mg #90 daily. Pt denies a history of liver pathology or symptoms.  Patient is otherwise healthy #4  Patient is also requesting to have the toenail removed today.  The toe was prepped in aseptic manner and digital block performed using 3 mL of 2% lidocaine plain.  The nail was avulsed in its entirety and dressings applied.  Post care instructions also provided #5 continue OTC Tolcylen antifungal topical as the nail begins to grow out  #6 return to clinic as needed   Edrick Kins, DPM [facility]  Dr. Edrick Kins, DPM    [address], [place] [address]                Office [phone]  Fax [phone]

## 2022-10-19 NOTE — Patient Instructions (Signed)

## 2023-02-08 ENCOUNTER — Telehealth: Payer: Self-pay | Admitting: Podiatrist

## 2023-02-08 ENCOUNTER — Other Ambulatory Visit (INDEPENDENT_AMBULATORY_CARE_PROVIDER_SITE_OTHER): Payer: Self-pay | Admitting: Podiatrist

## 2023-02-08 MED ORDER — TERBINAFINE HCL 250 MG PO TABS: 250.0000 mg | ORAL_TABLET | Freq: Every day | ORAL | 0 refills | Status: DC

## 2023-02-08 NOTE — Telephone Encounter (Signed)
Heidi Fritz left a vm on nurse line she lost a bottle of terbiniafine.  Will order a 30 day supply to replace this lost bottle.

## 2023-02-08 NOTE — Progress Notes (Signed)
Rx for terbinafine #30 called in to replace bottle that was lost.

## 2023-05-10 ENCOUNTER — Other Ambulatory Visit: Payer: Self-pay | Admitting: Podiatrist

## 2023-05-10 MED ORDER — TERBINAFINE HCL 250 MG PO TABS: 250.0000 mg | ORAL_TABLET | Freq: Every day | ORAL | 0 refills | Status: AC

## 2023-12-13 ENCOUNTER — Ambulatory Visit: Payer: Self-pay | Admitting: Podiatry

## 2024-01-22 ENCOUNTER — Ambulatory Visit: Payer: Self-pay | Admitting: Podiatry

## 2024-03-20 ENCOUNTER — Encounter: Payer: Self-pay | Admitting: Podiatry

## 2024-03-20 ENCOUNTER — Ambulatory Visit (INDEPENDENT_AMBULATORY_CARE_PROVIDER_SITE_OTHER): Payer: Self-pay | Admitting: Podiatry

## 2024-03-20 VITALS — Ht 62.0 in | Wt 141.0 lb

## 2024-03-20 DIAGNOSIS — L600 Ingrowing nail: Secondary | ICD-10-CM

## 2024-03-20 NOTE — Progress Notes (Signed)
   Chief Complaint  Patient presents with   Nail Problem    Pt is here to have left great toenail removed.    Subjective: 33 y.o. female presenting today for follow-up evaluation of thickening with discoloration to the left hallux nail plate.  Unfortunately the patient has tried multiple antifungal medication treatments with no improvement and the nail has been avulsed multiple times and it continues to grow back thick and dystrophic.  Past Medical History:  Diagnosis Date   Anxiety     Past Surgical History:  Procedure Laterality Date   CESAREAN SECTION N/A 11/10/2018   Procedure: CESAREAN SECTION;  Surgeon: Lorence Ozell CROME, MD;  Location: [facility];  Service: Obstetrics;  Laterality: N/A;   I & D EXTREMITY Left 02/19/2016   Procedure: IRRIGATION AND DEBRIDEMENT  AND REPAIR AS NECESSARY ARM , WRIST;  Surgeon: Elsie Mussel, MD;  Location: [facility];  Service: Orthopedics;  Laterality: Left;   NERVE REPAIR Left 02/19/2016   Procedure: NERVE REPAIR;  Surgeon: Elsie Mussel, MD;  Location: [facility];  Service: Orthopedics;  Laterality: Left;   TENDON REPAIR Left 02/19/2016   Procedure: TENDON REPAIR;  Surgeon: Elsie Mussel, MD;  Location: [facility] OR;  Service: Orthopedics;  Laterality: Left;    Allergies  Allergen Reactions   Codeine Nausea Only   Other     Objective: Physical Exam General: The patient is alert and oriented x3 in no acute distress.  Dermatology: Hyperkeratotic, discolored, thickened, onychodystrophy noted to the left hallux nail plate. Skin is warm, dry and supple bilateral lower extremities. Negative for open lesions or macerations.  Vascular: Palpable pedal pulses bilaterally. No edema or erythema noted. Capillary refill within normal limits.  Neurological: Grossly intact via light touch  Musculoskeletal Exam: No pedal deformity noted  Assessment: #1 Onychomycosis of toenail left hallux nail plate  Plan of Care:  -Patient evaluated -Unfortunately the nail has  been avulsed multiple times and it continues to regrow thick and dystrophic.  I do believe it is in the best interest for the patient to have the nail permanently avulsed.  She has tried antifungal medication with no improvement as well.  Today we discussed  total permanent nail avulsion.  The procedure was explained in detail as well as the long-term benefits and disadvantages associated.  She is amenable to permanently removing the nail plate -The toe was prepped in aseptic manner and digital block performed using 3 mL of 2% lidocaine plain.  The nail was avulsed in its entirety followed by 3 x 30-second application of phenol and alcohol flush.  Dressings applied.  Post care instructions provided -Return to clinic 4 weeks   Thresa EMERSON Sar, DPM [facility]  Dr. Thresa EMERSON Sar, DPM    [address] [place], [place] [address]                Office [phone]  Fax [phone]

## 2024-04-03 ENCOUNTER — Ambulatory Visit: Payer: Self-pay | Admitting: Podiatry
